# Patient Record
Sex: Male | Born: 1970 | Race: White | Hispanic: No | Marital: Married | Smoking: Never smoker
Health system: Southern US, Community
[De-identification: ages and names within clinical notes are randomized; demographics above are authoritative.]

## PROBLEM LIST (undated history)

## (undated) ENCOUNTER — Ambulatory Visit (HOSPITAL_COMMUNITY): Admission: EM | Payer: No Typology Code available for payment source | Source: Home / Self Care

## (undated) DIAGNOSIS — K219 Gastro-esophageal reflux disease without esophagitis: Secondary | ICD-10-CM

## (undated) DIAGNOSIS — R109 Unspecified abdominal pain: Secondary | ICD-10-CM

## (undated) DIAGNOSIS — E079 Disorder of thyroid, unspecified: Secondary | ICD-10-CM

## (undated) DIAGNOSIS — Z5189 Encounter for other specified aftercare: Secondary | ICD-10-CM

## (undated) DIAGNOSIS — W3400XA Accidental discharge from unspecified firearms or gun, initial encounter: Secondary | ICD-10-CM

## (undated) DIAGNOSIS — T7840XA Allergy, unspecified, initial encounter: Secondary | ICD-10-CM

## (undated) DIAGNOSIS — Y249XXA Unspecified firearm discharge, undetermined intent, initial encounter: Secondary | ICD-10-CM

## (undated) DIAGNOSIS — N189 Chronic kidney disease, unspecified: Secondary | ICD-10-CM

## (undated) DIAGNOSIS — R011 Cardiac murmur, unspecified: Secondary | ICD-10-CM

## (undated) DIAGNOSIS — M545 Low back pain: Secondary | ICD-10-CM

## (undated) DIAGNOSIS — R1319 Other dysphagia: Secondary | ICD-10-CM

## (undated) DIAGNOSIS — E039 Hypothyroidism, unspecified: Secondary | ICD-10-CM

## (undated) HISTORY — DX: Hypothyroidism, unspecified: E03.9

## (undated) HISTORY — PX: OTHER SURGICAL HISTORY: SHX169

## (undated) HISTORY — DX: Accidental discharge from unspecified firearms or gun, initial encounter: W34.00XA

## (undated) HISTORY — PX: KIDNEY SURGERY: SHX687

## (undated) HISTORY — DX: Unspecified firearm discharge, undetermined intent, initial encounter: Y24.9XXA

## (undated) HISTORY — PX: SPLENECTOMY, TOTAL: SHX788

## (undated) HISTORY — PX: HEMORROIDECTOMY: SUR656

## (undated) HISTORY — DX: Allergy, unspecified, initial encounter: T78.40XA

## (undated) HISTORY — DX: Cardiac murmur, unspecified: R01.1

## (undated) HISTORY — DX: Unspecified abdominal pain: R10.9

## (undated) HISTORY — DX: Encounter for other specified aftercare: Z51.89

## (undated) HISTORY — DX: Other dysphagia: R13.19

## (undated) HISTORY — DX: Chronic kidney disease, unspecified: N18.9

## (undated) HISTORY — DX: Disorder of thyroid, unspecified: E07.9

## (undated) HISTORY — DX: Low back pain: M54.5

## (undated) HISTORY — DX: Gastro-esophageal reflux disease without esophagitis: K21.9

---

## 2013-04-21 ENCOUNTER — Encounter: Payer: Self-pay | Admitting: General Practice

## 2013-04-21 ENCOUNTER — Ambulatory Visit (INDEPENDENT_AMBULATORY_CARE_PROVIDER_SITE_OTHER): Payer: 59 | Admitting: General Practice

## 2013-04-21 ENCOUNTER — Ambulatory Visit: Payer: Self-pay | Admitting: Physician Assistant

## 2013-04-21 VITALS — BP 128/83 | HR 60 | Temp 96.9°F | Ht 74.0 in | Wt 239.5 lb

## 2013-04-21 DIAGNOSIS — M545 Low back pain: Secondary | ICD-10-CM

## 2013-04-21 MED ORDER — METHYLPREDNISOLONE ACETATE 40 MG/ML IJ SUSP
40.0000 mg | Freq: Once | INTRAMUSCULAR | Status: AC
  Administered 2013-04-21: 40 mg via INTRAMUSCULAR

## 2013-04-21 NOTE — Progress Notes (Signed)
  Subjective:    Patient ID: Steve Taylor, male    DOB: 1971/02/07, 42 y.o.   MRN: [id number]  Back Pain This is a recurrent problem. The current episode started yesterday. The problem occurs intermittently. The problem has been gradually worsening since onset. The pain is present in the lumbar spine (Right). The pain does not radiate (reports it has radiated down leg in the past). The pain is at a severity of 6/10. The symptoms are aggravated by standing. Stiffness is present in the morning. Pertinent negatives include no bladder incontinence, bowel incontinence, chest pain, dysuria, fever, numbness, pelvic pain or tingling. He has tried nothing for the symptoms.       Review of Systems  Constitutional: Negative for fever and chills.  HENT: Negative for neck pain.   Respiratory: Negative for chest tightness and shortness of breath.   Cardiovascular: Negative for chest pain and palpitations.  Gastrointestinal: Negative for bowel incontinence.  Genitourinary: Negative for bladder incontinence, dysuria, difficulty urinating and pelvic pain.  Musculoskeletal: Positive for back pain.  Skin: Negative.   Neurological: Negative for tingling and numbness.  All other systems reviewed and are negative.       Objective:   Physical Exam  Constitutional: He is oriented to person, place, and time. He appears well-developed and well-nourished.  Cardiovascular: Normal rate, regular rhythm and normal heart sounds.   Pulmonary/Chest: Effort normal and breath sounds normal.  Neurological: He is alert and oriented to person, place, and time.  Skin: Skin is warm and dry.  Psychiatric: He has a normal mood and affect.          Assessment & Plan:  1. Lumbar pain - methylPREDNISolone acetate (DEPO-MEDROL) injection 40 mg; Inject 1 mL (40 mg total) into the muscle once. -rest -apply heat to affected area three times a day for 10 minutes -motrin or tylenol for discomfort as directed -RTO if  symptoms worsen -will monitor frequency of pain and consider ortho specialist -Patient verbalized understanding -Coralie Keens, FNP-C

## 2013-04-21 NOTE — Patient Instructions (Signed)
Back Pain, Adult  Low back pain is very common. About 1 in 5 people have back pain. The cause of low back pain is rarely dangerous. The pain often gets better over time. About half of people with a sudden onset of back pain feel better in just 2 weeks. About 8 in 10 people feel better by 6 weeks.   CAUSES  Some common causes of back pain include:  · Strain of the muscles or ligaments supporting the spine.  · Wear and tear (degeneration) of the spinal discs.  · Arthritis.  · Direct injury to the back.  DIAGNOSIS  Most of the time, the direct cause of low back pain is not known. However, back pain can be treated effectively even when the exact cause of the pain is unknown. Answering your caregiver's questions about your overall health and symptoms is one of the most accurate ways to make sure the cause of your pain is not dangerous. If your caregiver needs more information, he or she may order lab work or imaging tests (X-rays or MRIs). However, even if imaging tests show changes in your back, this usually does not require surgery.  HOME CARE INSTRUCTIONS  For many people, back pain returns. Since low back pain is rarely dangerous, it is often a condition that people can learn to manage on their own.   · Remain active. It is stressful on the back to sit or stand in one place. Do not sit, drive, or stand in one place for more than 30 minutes at a time. Take short walks on level surfaces as soon as pain allows. Try to increase the length of time you walk each day.  · Do not stay in bed. Resting more than 1 or 2 days can delay your recovery.  · Do not avoid exercise or work. Your body is made to move. It is not dangerous to be active, even though your back may hurt. Your back will likely heal faster if you return to being active before your pain is gone.  · Pay attention to your body when you  bend and lift. Many people have less discomfort when lifting if they bend their knees, keep the load close to their bodies, and  avoid twisting. Often, the most comfortable positions are those that put less stress on your recovering back.  · Find a comfortable position to sleep. Use a firm mattress and lie on your side with your knees slightly bent. If you lie on your back, put a pillow under your knees.  · Only take over-the-counter or prescription medicines as directed by your caregiver. Over-the-counter medicines to reduce pain and inflammation are often the most helpful. Your caregiver may prescribe muscle relaxant drugs. These medicines help dull your pain so you can more quickly return to your normal activities and healthy exercise.  · Put ice on the injured area.  · Put ice in a plastic bag.  · Place a towel between your skin and the bag.  · Leave the ice on for 15-20 minutes, 3-4 times a day for the first 2 to 3 days. After that, ice and heat may be alternated to reduce pain and spasms.  · Ask your caregiver about trying back exercises and gentle massage. This may be of some benefit.  · Avoid feeling anxious or stressed. Stress increases muscle tension and can worsen back pain. It is important to recognize when you are anxious or stressed and learn ways to manage it. Exercise is a great option.  SEEK MEDICAL CARE IF:  · You have pain that is not relieved with rest or   medicine.  · You have pain that does not improve in 1 week.  · You have new symptoms.  · You are generally not feeling well.  SEEK IMMEDIATE MEDICAL CARE IF:   · You have pain that radiates from your back into your legs.  · You develop new bowel or bladder control problems.  · You have unusual weakness or numbness in your arms or legs.  · You develop nausea or vomiting.  · You develop abdominal pain.  · You feel faint.  Document Released: 11/03/2005 Document Revised: 05/04/2012 Document Reviewed: 03/24/2011  Exit[employer]® Patient Information ©2014 [employer].

## 2013-06-16 ENCOUNTER — Ambulatory Visit: Payer: 59 | Admitting: General Practice

## 2014-01-16 ENCOUNTER — Encounter: Payer: Self-pay | Admitting: Family Medicine

## 2014-01-16 ENCOUNTER — Ambulatory Visit (INDEPENDENT_AMBULATORY_CARE_PROVIDER_SITE_OTHER): Payer: 59 | Admitting: Family Medicine

## 2014-01-16 VITALS — BP 121/87 | HR 63 | Temp 97.5°F | Ht 74.0 in | Wt 236.2 lb

## 2014-01-16 DIAGNOSIS — J069 Acute upper respiratory infection, unspecified: Secondary | ICD-10-CM

## 2014-01-16 MED ORDER — AZITHROMYCIN 250 MG PO TABS: ORAL_TABLET | ORAL | Status: DC

## 2014-01-16 NOTE — Progress Notes (Signed)
   Subjective:    Patient ID: Steve Taylor, male    DOB: 10-08-1971, 43 y.o.   MRN: [id number]  HPI  This 43 y.o. male presents for evaluation of uri sx's and cough for over a week.  He is taking Parkline that is not helping.  Review of Systems    No chest pain, SOB, HA, dizziness, vision change, N/V, diarrhea, constipation, dysuria, urinary urgency or frequency, myalgias, arthralgias or rash.  Objective:   Physical Exam  Vital signs noted  Well developed well nourished male.  HEENT - Head atraumatic Normocephalic                Eyes - PERRLA, Conjuctiva - clear Sclera- Clear EOMI                Ears - EAC's Wnl TM's Wnl Gross Hearing WNL                Nose - Nares patent                 Throat - oropharanx wnl Respiratory - Lungs CTA bilateral Cardiac - RRR S1 and S2 without murmur GI - Abdomen soft Nontender and bowel sounds active x 4 Extremities - No edema. Neuro - Grossly intact.      Assessment & Plan:  URI (upper respiratory infection) - Plan: azithromycin (ZITHROMAX) 250 MG tablet Push po fluids, rest, tylenol and motrin otc prn as directed for fever, arthralgias, and myalgias.  Follow up prn if sx's continue or persist.  Lysbeth Penner FNP

## 2014-03-22 ENCOUNTER — Other Ambulatory Visit: Payer: Self-pay | Admitting: *Deleted

## 2014-03-22 DIAGNOSIS — Z0000 Encounter for general adult medical examination without abnormal findings: Secondary | ICD-10-CM

## 2014-03-24 ENCOUNTER — Encounter (INDEPENDENT_AMBULATORY_CARE_PROVIDER_SITE_OTHER): Payer: Self-pay

## 2014-03-24 ENCOUNTER — Other Ambulatory Visit (INDEPENDENT_AMBULATORY_CARE_PROVIDER_SITE_OTHER): Payer: 59

## 2014-03-24 DIAGNOSIS — Z0000 Encounter for general adult medical examination without abnormal findings: Secondary | ICD-10-CM

## 2014-03-24 LAB — POCT CBC
Granulocyte percent: 64.3 %G (ref 37–80)
HEMATOCRIT: 46.7 % (ref 43.5–53.7)
HEMOGLOBIN: 15.1 g/dL (ref 14.1–18.1)
Lymph, poc: 3.5 — AB (ref 0.6–3.4)
MCH: 29.8 pg (ref 27–31.2)
MCHC: 32.5 g/dL (ref 31.8–35.4)
MCV: 91.7 fL (ref 80–97)
MPV: 9.5 fL (ref 0–99.8)
POC Granulocyte: 6.4 (ref 2–6.9)
POC LYMPH PERCENT: 34.6 %L (ref 10–50)
Platelet Count, POC: 323 10*3/uL (ref 142–424)
RBC: 5.1 M/uL (ref 4.69–6.13)
RDW, POC: 14 %
WBC: 10 10*3/uL (ref 4.6–10.2)

## 2014-03-24 NOTE — Progress Notes (Signed)
Pt came in for lab  only 

## 2014-03-25 LAB — CMP14+EGFR
ALBUMIN: 4.2 g/dL (ref 3.5–5.5)
ALK PHOS: 74 IU/L (ref 39–117)
ALT: 32 IU/L (ref 0–44)
AST: 22 IU/L (ref 0–40)
Albumin/Globulin Ratio: 1.7 (ref 1.1–2.5)
BILIRUBIN TOTAL: 0.6 mg/dL (ref 0.0–1.2)
BUN / CREAT RATIO: 14 (ref 9–20)
BUN: 14 mg/dL (ref 6–24)
CHLORIDE: 102 mmol/L (ref 97–108)
CO2: 26 mmol/L (ref 18–29)
Calcium: 9.3 mg/dL (ref 8.7–10.2)
Creatinine, Ser: 1 mg/dL (ref 0.76–1.27)
GFR calc non Af Amer: 93 mL/min/{1.73_m2} (ref >59)
GFR, EST AFRICAN AMERICAN: 108 mL/min/{1.73_m2} (ref >59)
Globulin, Total: 2.5 g/dL (ref 1.5–4.5)
Glucose: 99 mg/dL (ref 65–99)
POTASSIUM: 4.3 mmol/L (ref 3.5–5.2)
SODIUM: 142 mmol/L (ref 134–144)
Total Protein: 6.7 g/dL (ref 6.0–8.5)

## 2014-03-25 LAB — LIPID PANEL
CHOLESTEROL TOTAL: 187 mg/dL (ref 100–199)
Chol/HDL Ratio: 5.5 ratio units — ABNORMAL HIGH (ref 0.0–5.0)
HDL: 34 mg/dL — AB (ref >39)
LDL Calculated: 118 mg/dL — ABNORMAL HIGH (ref 0–99)
TRIGLYCERIDES: 175 mg/dL — AB (ref 0–149)
VLDL CHOLESTEROL CAL: 35 mg/dL (ref 5–40)

## 2014-03-25 LAB — THYROID PANEL WITH TSH
FREE THYROXINE INDEX: 1.6 (ref 1.2–4.9)
T3 UPTAKE RATIO: 29 % (ref 24–39)
T4, Total: 5.6 ug/dL (ref 4.5–12.0)
TSH: 7.52 u[IU]/mL — AB (ref 0.450–4.500)

## 2014-03-30 ENCOUNTER — Encounter: Payer: Self-pay | Admitting: Physician Assistant

## 2014-03-30 ENCOUNTER — Ambulatory Visit (INDEPENDENT_AMBULATORY_CARE_PROVIDER_SITE_OTHER): Payer: 59 | Admitting: Physician Assistant

## 2014-03-30 VITALS — BP 132/89 | HR 54 | Temp 98.1°F | Wt 237.0 lb

## 2014-03-30 DIAGNOSIS — M545 Low back pain, unspecified: Secondary | ICD-10-CM

## 2014-03-30 DIAGNOSIS — Z0000 Encounter for general adult medical examination without abnormal findings: Secondary | ICD-10-CM

## 2014-03-30 DIAGNOSIS — M549 Dorsalgia, unspecified: Secondary | ICD-10-CM

## 2014-03-30 HISTORY — DX: Low back pain, unspecified: M54.50

## 2014-03-30 MED ORDER — METHYLPREDNISOLONE ACETATE 80 MG/ML IJ SUSP
80.0000 mg | Freq: Once | INTRAMUSCULAR | Status: AC
  Administered 2014-03-30: 80 mg via INTRAMUSCULAR

## 2014-03-30 NOTE — Progress Notes (Signed)
Subjective:     Patient ID: Steve Taylor, male   DOB: [birth year], 43 y.o.   MRN: [id number]  HPI Pt here today for a PE Pt has noted some recent fatigue and was concerned  He normally is very active Sx have since resolved Notes a hx of prev elevation of TSH that returned to normal with f/u testing Pt also with recent flare of LBP without an injury He will get this ~ 1-2 times a year No hx of prev back surgery  Review of Systems  Constitutional: Positive for fatigue.  Musculoskeletal: Positive for back pain.       No radiation of pain to the lower ext Primary pain to the R L-spine  All other systems reviewed and are negative.      Objective:   Physical Exam  Nursing note and vitals reviewed. Constitutional: He is oriented to person, place, and time. He appears well-developed and well-nourished.  HENT:  Head: Normocephalic.  Right Ear: External ear normal.  Left Ear: External ear normal.  Mouth/Throat: Oropharynx is clear and moist. No oropharyngeal exudate.  Eyes: Conjunctivae and EOM are normal. Pupils are equal, round, and reactive to light. Right eye exhibits no discharge. Left eye exhibits no discharge.  Neck: Normal range of motion. Neck supple. No JVD present. No thyromegaly present.  Cardiovascular: Normal rate, regular rhythm, normal heart sounds and intact distal pulses.   Pulmonary/Chest: Effort normal and breath sounds normal.  Abdominal: Soft. Bowel sounds are normal. He exhibits no distension and no mass. There is no tenderness. There is no rebound.  Musculoskeletal: Normal range of motion. He exhibits tenderness. He exhibits no edema.  + TTP along the entire L-spine ++ TTP over the R SI joint No area of ecchy or palp muscle spasm  Lymphadenopathy:    He has no cervical adenopathy.  Neurological: He is alert and oriented to person, place, and time. He has normal reflexes.  Skin: Skin is warm.  Psychiatric: He has a normal mood and affect. His behavior is  normal.       Assessment:     Physical exam LBP Fatigue    Plan:     Reviewed lab with pt today His TSH is sl elevated Discussed options with pt  He would like to wait 1 month a retest to see if this returns to nl Recommended PSA testing at that time as well Depomedrol  80 IM today that has worked well for his LBP in the past Reviewed ways with him to minimize hs LBP flares F/U in 1 month for retesting of labs sooner if any problems

## 2014-03-30 NOTE — Patient Instructions (Signed)

## 2014-04-24 ENCOUNTER — Other Ambulatory Visit (INDEPENDENT_AMBULATORY_CARE_PROVIDER_SITE_OTHER): Payer: 59

## 2014-04-24 DIAGNOSIS — R7989 Other specified abnormal findings of blood chemistry: Secondary | ICD-10-CM

## 2014-04-24 DIAGNOSIS — Z125 Encounter for screening for malignant neoplasm of prostate: Secondary | ICD-10-CM

## 2014-04-25 LAB — TSH: TSH: 8.74 u[IU]/mL — ABNORMAL HIGH (ref 0.450–4.500)

## 2014-04-25 LAB — PSA, TOTAL AND FREE
PSA, Free Pct: 34.5 %
PSA, Free: 0.76 ng/mL
PSA: 2.2 ng/mL (ref 0.0–4.0)

## 2014-04-26 ENCOUNTER — Other Ambulatory Visit: Payer: Self-pay | Admitting: Family Medicine

## 2014-04-28 ENCOUNTER — Other Ambulatory Visit: Payer: Self-pay | Admitting: Physician Assistant

## 2014-04-28 MED ORDER — LEVOTHYROXINE SODIUM 25 MCG PO TABS: 25.0000 ug | ORAL_TABLET | Freq: Every day | ORAL | Status: DC

## 2014-04-28 NOTE — Addendum Note (Signed)
Addended by: Lodema Pilot on: 04/28/2014 05:29 PM   Modules accepted: Orders

## 2014-05-22 ENCOUNTER — Telehealth: Payer: Self-pay | Admitting: General Practice

## 2014-05-22 DIAGNOSIS — R1310 Dysphagia, unspecified: Secondary | ICD-10-CM

## 2014-05-22 NOTE — Telephone Encounter (Signed)
Referral made 

## 2014-05-22 NOTE — Telephone Encounter (Signed)
Aware. 

## 2014-05-25 ENCOUNTER — Encounter: Payer: Self-pay | Admitting: Nurse Practitioner

## 2014-05-29 ENCOUNTER — Encounter: Payer: Self-pay | Admitting: *Deleted

## 2014-06-05 ENCOUNTER — Encounter: Payer: Self-pay | Admitting: Nurse Practitioner

## 2014-06-05 ENCOUNTER — Ambulatory Visit (INDEPENDENT_AMBULATORY_CARE_PROVIDER_SITE_OTHER): Payer: 59 | Admitting: Nurse Practitioner

## 2014-06-05 VITALS — BP 126/84 | HR 84 | Ht 75.0 in | Wt 231.0 lb

## 2014-06-05 DIAGNOSIS — R1319 Other dysphagia: Secondary | ICD-10-CM | POA: Insufficient documentation

## 2014-06-05 HISTORY — DX: Other dysphagia: R13.19

## 2014-06-05 NOTE — Patient Instructions (Signed)

## 2014-06-05 NOTE — Progress Notes (Signed)
    HPI :  Patient is a 43 year old male, new to this practice, here for evaluation of dysphasia. Approximately 5 years ago the patient went to the emergency department for food impaction. It sounds like he was administered glucagon and food was subsequently regurgitated . Since that time patient has had intermittent solid food dysphagia, mainly to meat. No weight loss. No chest pain. He has no history of GERD. Comprehensive metabolic profile and CBC in May of this year was normal.  Past Medical History  Diagnosis Date  . Thyroid disease     Family History  Problem Relation Age of Onset  . Lung cancer Father   . Brain cancer Maternal Grandmother   . Diabetes Mother   . Diabetes Maternal Grandfather    History  Substance Use Topics  . Smoking status: Never Smoker   . Smokeless tobacco: Not on file  . Alcohol Use: Yes     Comment: occ   Current Outpatient Prescriptions  Medication Sig Dispense Refill  . levothyroxine (SYNTHROID, LEVOTHROID) 25 MCG tablet Take 25 mcg by mouth daily before breakfast.       No current facility-administered medications for this visit.   No Known Allergies   Review of Systems: All systems reviewed and negative except where noted in HPI.   Physical Exam: BP 126/84  Pulse 84  Ht 6\' 3"  (1.905 m)  Wt 231 lb (104.781 kg)  BMI 28.87 kg/m2 Constitutional: Pleasant,well-developed, white male in no acute distress. HEENT: Normocephalic and atraumatic. Conjunctivae are normal. No scleral icterus. Neck supple.  Cardiovascular: Normal rate, regular rhythm.  Pulmonary/chest: Effort normal and breath sounds normal. No wheezing, rales or rhonchi. Abdominal: Soft, nondistended, nontender. Bowel sounds active throughout. There are no masses palpable. No hepatomegaly. Extremities: no edema Lymphadenopathy: No cervical adenopathy noted. Neurological: Alert and oriented to person place and time. Skin: Skin is warm and dry. No rashes noted. Psychiatric: Normal  mood and affect. Behavior is normal.   ASSESSMENT AND PLAN:   43 year old male with a five-year history of intermittent solid food dysphasia. Rule out esophageal stricture, eosinophilic esophagitis. For further evaluation patient will be scheduled for EGD with probable dilation. The benefits, risks, and potential complications of EGD with possible biopsies and/or dilation were discussed with the patient and he agrees to proceed.

## 2014-06-05 NOTE — Progress Notes (Signed)
Reviewed and agree with management. Marsha Hillman D. Richele Strand, M.D., FACG  

## 2014-06-13 ENCOUNTER — Telehealth: Payer: Self-pay | Admitting: *Deleted

## 2014-06-13 NOTE — Telephone Encounter (Signed)
I told the patient his procedure is not 4:00 PM and not 3:30 Pm .  I neglected to put his case in Epic and that spot was taken. He said the 4:00 Pm time is fine.  I told him I will mail him new instructions and he thanked me for calling.

## 2014-06-19 ENCOUNTER — Other Ambulatory Visit (INDEPENDENT_AMBULATORY_CARE_PROVIDER_SITE_OTHER): Payer: 59

## 2014-06-19 DIAGNOSIS — R7989 Other specified abnormal findings of blood chemistry: Secondary | ICD-10-CM

## 2014-06-20 LAB — THYROID PANEL WITH TSH
FREE THYROXINE INDEX: 2.1 (ref 1.2–4.9)
T3 UPTAKE RATIO: 30 % (ref 24–39)
T4 TOTAL: 7.1 ug/dL (ref 4.5–12.0)
TSH: 4.51 u[IU]/mL — ABNORMAL HIGH (ref 0.450–4.500)

## 2014-06-21 ENCOUNTER — Telehealth: Payer: Self-pay | Admitting: *Deleted

## 2014-06-21 MED ORDER — LEVOTHYROXINE SODIUM 25 MCG PO TABS: 25.0000 ug | ORAL_TABLET | Freq: Every day | ORAL | Status: DC

## 2014-06-21 NOTE — Telephone Encounter (Signed)
done

## 2014-07-26 ENCOUNTER — Encounter: Payer: Self-pay | Admitting: Gastroenterology

## 2014-07-26 ENCOUNTER — Ambulatory Visit (AMBULATORY_SURGERY_CENTER): Payer: 59 | Admitting: Gastroenterology

## 2014-07-26 VITALS — BP 143/65 | HR 49 | Temp 97.7°F | Resp 23 | Ht 75.0 in | Wt 231.0 lb

## 2014-07-26 DIAGNOSIS — R1319 Other dysphagia: Secondary | ICD-10-CM

## 2014-07-26 DIAGNOSIS — K222 Esophageal obstruction: Secondary | ICD-10-CM

## 2014-07-26 MED ORDER — SODIUM CHLORIDE 0.9 % IV SOLN: 500.0000 mL | INTRAVENOUS | Status: DC

## 2014-07-26 NOTE — Patient Instructions (Addendum)

## 2014-07-26 NOTE — Progress Notes (Signed)
Report to PACU, RN, vss, BBS= Clear.  

## 2014-07-26 NOTE — Progress Notes (Signed)
Called to room to assist during endoscopic procedure.  Patient ID and intended procedure confirmed with present staff. Received instructions for my participation in the procedure from the performing physician.  

## 2014-07-26 NOTE — Op Note (Signed)
[facility]  [address]. [place], [address]   ENDOSCOPY PROCEDURE REPORT  PATIENT: Steve Taylor, Steve Taylor  MR#: [id number] BIRTHDATE: Apr 03, 1971 , 43  yrs. old GENDER: Male ENDOSCOPIST: Inda Castle, MD REFERRED BY:  Redge Gainer, M.D. PROCEDURE DATE:  07/26/2014 PROCEDURE:  EGD, diagnostic and Maloney dilation of esophagus ASA CLASS:     Class II INDICATIONS:  Dysphagia. MEDICATIONS: MAC sedation, administered by CRNA and propofol (Diprivan) 200mg  IV TOPICAL ANESTHETIC:  DESCRIPTION OF PROCEDURE: After the risks benefits and alternatives of the procedure were thoroughly explained, informed consent was obtained.  The LB [id number] endoscope was introduced through the mouth and advanced to the third portion of the duodenum. Without limitations.  The instrument was slowly withdrawn as the mucosa was fully examined.        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  The stenosis was traversable with the endoscope.  The remainder of the upper endoscopy exam was otherwise normal. Retroflexed views revealed no abnormalities.     The scope was then withdrawn from the patient and the procedure completed.  A #52 Isabell Jarvis dilator was passed with moderate resistance.  There was no heme.  COMPLICATIONS: There were no complications. ENDOSCOPIC IMPRESSION: esophageal stricture-status post Venia Minks dilation  RECOMMENDATIONS: Repeat dilation as needed REPEAT EXAM:  eSigned:  Inda Castle, MD 07/26/2014 4:03 PM   CC:

## 2014-07-27 ENCOUNTER — Telehealth: Payer: Self-pay | Admitting: *Deleted

## 2014-07-27 NOTE — Telephone Encounter (Signed)
  Follow up Call-  Call back number 07/26/2014  Post procedure Call Back phone  # [phone] cell  Permission to leave phone message Yes     No answer at # given.  Left message on voicemail.

## 2015-05-01 ENCOUNTER — Encounter (INDEPENDENT_AMBULATORY_CARE_PROVIDER_SITE_OTHER): Payer: Self-pay

## 2015-05-01 ENCOUNTER — Ambulatory Visit (INDEPENDENT_AMBULATORY_CARE_PROVIDER_SITE_OTHER): Payer: 59 | Admitting: Physician Assistant

## 2015-05-01 ENCOUNTER — Encounter: Payer: Self-pay | Admitting: Physician Assistant

## 2015-05-01 VITALS — BP 133/95 | HR 87 | Temp 97.3°F | Ht 75.0 in | Wt 234.0 lb

## 2015-05-01 DIAGNOSIS — L255 Unspecified contact dermatitis due to plants, except food: Secondary | ICD-10-CM | POA: Diagnosis not present

## 2015-05-01 MED ORDER — PREDNISONE 10 MG (21) PO TBPK: ORAL_TABLET | ORAL | Status: DC

## 2015-05-01 MED ORDER — METHYLPREDNISOLONE ACETATE 80 MG/ML IJ SUSP
80.0000 mg | Freq: Once | INTRAMUSCULAR | Status: AC
  Administered 2015-05-01: 80 mg via INTRAMUSCULAR

## 2015-05-01 MED ORDER — TRIAMCINOLONE ACETONIDE 0.1 % EX CREA: 1.0000 "application " | TOPICAL_CREAM | Freq: Two times a day (BID) | CUTANEOUS | Status: DC

## 2015-05-01 NOTE — Progress Notes (Signed)
   Subjective:    Patient ID: Steve Taylor, male    DOB: 1970/12/23, 44 y.o.   MRN: [id number]  HPI 44 y/o male presents with c/o poison oak x 1 day. He was exposed to it yesterday afternoon while weedeating. He has had similar episodes before with relief from steroid injection     Review of Systems  Skin: Positive for rash (BUE, face, chest - pruritic ).  All other systems reviewed and are negative.      Objective:   Physical Exam  Constitutional: He appears well-developed and well-nourished.  Skin:  erythematous linear with papules and vesicular rash on BUE, face          Assessment & Plan:  1. Contact dermatitis due to plant  - methylPREDNISolone acetate (DEPO-MEDROL) injection 80 mg; Inject 1 mL (80 mg total) into the muscle once. - predniSONE (STERAPRED UNI-PAK 21 TAB) 10 MG (21) TBPK tablet; 6 pills on day 1, 5 pills on day 2, 4 pills on day 3, 3 pills on day 4, 2 pills on day 5, 1 pill on day 6  Dispense: 21 tablet; Refill: 0 - triamcinolone cream (KENALOG) 0.1 %; Apply 1 application topically 2 (two) times daily. To affected area  Dispense: 80 g; Refill: 0  F/U prn   Garrison Michie A. Benjamin Stain PA-C

## 2015-07-25 ENCOUNTER — Encounter (INDEPENDENT_AMBULATORY_CARE_PROVIDER_SITE_OTHER): Payer: Self-pay

## 2015-07-25 ENCOUNTER — Encounter: Payer: Self-pay | Admitting: Physician Assistant

## 2015-07-25 ENCOUNTER — Ambulatory Visit (INDEPENDENT_AMBULATORY_CARE_PROVIDER_SITE_OTHER): Payer: 59 | Admitting: Physician Assistant

## 2015-07-25 VITALS — BP 138/94 | HR 64 | Temp 98.2°F | Ht 75.0 in | Wt 234.0 lb

## 2015-07-25 DIAGNOSIS — D485 Neoplasm of uncertain behavior of skin: Secondary | ICD-10-CM | POA: Diagnosis not present

## 2015-07-27 ENCOUNTER — Encounter: Payer: 59 | Admitting: Family Medicine

## 2015-07-30 ENCOUNTER — Other Ambulatory Visit: Payer: Self-pay | Admitting: Physician Assistant

## 2015-07-30 DIAGNOSIS — T814XXA Infection following a procedure, initial encounter: Principal | ICD-10-CM

## 2015-07-30 DIAGNOSIS — IMO0001 Reserved for inherently not codable concepts without codable children: Secondary | ICD-10-CM

## 2015-07-30 MED ORDER — SULFAMETHOXAZOLE-TRIMETHOPRIM 800-160 MG PO TABS: 1.0000 | ORAL_TABLET | Freq: Every day | ORAL | Status: DC

## 2015-08-01 ENCOUNTER — Encounter: Payer: Self-pay | Admitting: Physician Assistant

## 2015-08-01 ENCOUNTER — Ambulatory Visit (INDEPENDENT_AMBULATORY_CARE_PROVIDER_SITE_OTHER): Payer: 59 | Admitting: Physician Assistant

## 2015-08-01 VITALS — BP 135/92 | HR 79 | Temp 98.0°F | Ht 75.0 in | Wt 238.0 lb

## 2015-08-01 DIAGNOSIS — Z4802 Encounter for removal of sutures: Secondary | ICD-10-CM | POA: Diagnosis not present

## 2015-08-01 DIAGNOSIS — D485 Neoplasm of uncertain behavior of skin: Secondary | ICD-10-CM

## 2015-08-01 LAB — PATHOLOGY

## 2015-08-05 ENCOUNTER — Encounter: Payer: Self-pay | Admitting: Physician Assistant

## 2015-08-05 NOTE — Progress Notes (Signed)
Patient ID: Steve Taylor, male   DOB: 1971/05/18, 44 y.o.   MRN: [id number]  44 y/o male presents with enlarging "mole" on the back of his head. It is irritated by his cap, when he gets a hair cut and occasionally bleeds.   1cm raised flesh colored papule on posterior scalp was noted and excised via punch excision r/o dysplasia. 1- 4.0 vicryl suture was applied and 2- 5.0 nylon sutures. F/U in 7 days for suture removal   1. Neoplasm of uncertain behavior of skin - Pathology   F/U 7 days   Tiffany A. Benjamin Stain PA-C

## 2015-08-05 NOTE — Progress Notes (Signed)
Patient ID: KEONE KAMER, male   DOB: May 22, 1971, 44 y.o.   MRN: [id number]  44 y/o male presents for removal of sutures s/p excision of nevus on posterior scalp 7 days ago. He has not had any complications.   PE reveals slight irritation d/t internal suture being slightly superficial. Negative for drainage.   Sutures were removed x 2. No further treatment required at this time.   Tiffany A. Benjamin Stain PA-C

## 2015-10-16 ENCOUNTER — Telehealth: Payer: Self-pay | Admitting: Family Medicine

## 2015-11-17 ENCOUNTER — Telehealth: Payer: Self-pay | Admitting: Pediatrics

## 2015-11-17 MED ORDER — AMOXICILLIN 500 MG PO CAPS: 500.0000 mg | ORAL_CAPSULE | Freq: Two times a day (BID) | ORAL | Status: DC

## 2015-11-17 NOTE — Telephone Encounter (Signed)
After hours call, red throat, white exudates, no cough, muscle aches. Not sure if having LAD or not. Ideally would send for strep test, clinic closed next three days. Sent in amox rx.

## 2015-11-23 ENCOUNTER — Ambulatory Visit (INDEPENDENT_AMBULATORY_CARE_PROVIDER_SITE_OTHER): Payer: 59 | Admitting: Family Medicine

## 2015-11-23 ENCOUNTER — Encounter: Payer: Self-pay | Admitting: Family Medicine

## 2015-11-23 VITALS — BP 127/86 | HR 78 | Temp 97.5°F | Ht 75.0 in | Wt 242.8 lb

## 2015-11-23 DIAGNOSIS — R109 Unspecified abdominal pain: Secondary | ICD-10-CM

## 2015-11-23 DIAGNOSIS — R1011 Right upper quadrant pain: Secondary | ICD-10-CM | POA: Diagnosis not present

## 2015-11-23 HISTORY — DX: Unspecified abdominal pain: R10.9
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
## 2015-11-23 NOTE — Patient Instructions (Signed)
Great to see you!  If you develop a rash let us know right away  If you do not get better as expected or worsen please let us know or come back.

## 2015-11-24 LAB — CBC WITH DIFFERENTIAL/PLATELET
BASOS ABS: 0.2 10*3/uL (ref 0.0–0.2)
Basos: 2 %
EOS (ABSOLUTE): 0.6 10*3/uL — AB (ref 0.0–0.4)
Eos: 5 %
Hematocrit: 43.5 % (ref 37.5–51.0)
Hemoglobin: 15.5 g/dL (ref 12.6–17.7)
Immature Grans (Abs): 0 10*3/uL (ref 0.0–0.1)
Immature Granulocytes: 0 %
Lymphocytes Absolute: 4.1 10*3/uL — ABNORMAL HIGH (ref 0.7–3.1)
Lymphs: 36 %
MCH: 30.4 pg (ref 26.6–33.0)
MCHC: 35.6 g/dL (ref 31.5–35.7)
MCV: 85 fL (ref 79–97)
Monocytes Absolute: 0.9 10*3/uL (ref 0.1–0.9)
Monocytes: 8 %
NEUTROS ABS: 5.6 10*3/uL (ref 1.4–7.0)
NEUTROS PCT: 49 %
PLATELETS: 408 10*3/uL — AB (ref 150–379)
RBC: 5.1 x10E6/uL (ref 4.14–5.80)
RDW: 14.6 % (ref 12.3–15.4)
WBC: 11.4 10*3/uL — ABNORMAL HIGH (ref 3.4–10.8)

## 2015-11-24 LAB — CMP14+EGFR
A/G RATIO: 1.9 (ref 1.1–2.5)
ALK PHOS: 79 IU/L (ref 39–117)
ALT: 55 IU/L — ABNORMAL HIGH (ref 0–44)
AST: 36 IU/L (ref 0–40)
Albumin: 4.5 g/dL (ref 3.5–5.5)
BILIRUBIN TOTAL: 0.4 mg/dL (ref 0.0–1.2)
BUN/Creatinine Ratio: 12 (ref 9–20)
BUN: 13 mg/dL (ref 6–24)
CHLORIDE: 102 mmol/L (ref 96–106)
CO2: 21 mmol/L (ref 18–29)
Calcium: 9.6 mg/dL (ref 8.7–10.2)
Creatinine, Ser: 1.1 mg/dL (ref 0.76–1.27)
GFR calc Af Amer: 95 mL/min/{1.73_m2} (ref >59)
GFR calc non Af Amer: 82 mL/min/{1.73_m2} (ref >59)
Globulin, Total: 2.4 g/dL (ref 1.5–4.5)
Glucose: 105 mg/dL — ABNORMAL HIGH (ref 65–99)
POTASSIUM: 4.1 mmol/L (ref 3.5–5.2)
Sodium: 144 mmol/L (ref 134–144)
Total Protein: 6.9 g/dL (ref 6.0–8.5)

## 2015-11-24 LAB — LIPID PANEL
CHOLESTEROL TOTAL: 209 mg/dL — AB (ref 100–199)
Chol/HDL Ratio: 7 ratio units — ABNORMAL HIGH (ref 0.0–5.0)
HDL: 30 mg/dL — AB (ref >39)
TRIGLYCERIDES: 473 mg/dL — AB (ref 0–149)

## 2015-11-24 LAB — LIPASE: LIPASE: 49 U/L (ref 0–59)

## 2015-11-26 ENCOUNTER — Telehealth: Payer: Self-pay | Admitting: Family Medicine

## 2015-11-26 DIAGNOSIS — R1011 Right upper quadrant pain: Secondary | ICD-10-CM

## 2015-11-26 NOTE — Telephone Encounter (Signed)
Absolutely, ordered.   Laroy Apple, MD [facility] 11/26/2015, 5:04 PM

## 2015-11-26 NOTE — Telephone Encounter (Signed)
Called and discussed labs  Mild leukocytosis, mildly elevated PLT, mildly elevated platelets. Still no clear etiology Considering his elevated triglycerides and cholesterol of asked him to come back for fasting labs in the next 2 weeks, that time I'll repeat CMP and add on acute hepatitis panel for completeness  He denies tylenol and alcohol use.   Laroy Apple, MD [facility] 11/26/2015, 1:18 PM

## 2015-11-27 ENCOUNTER — Other Ambulatory Visit (INDEPENDENT_AMBULATORY_CARE_PROVIDER_SITE_OTHER): Payer: 59

## 2015-11-27 DIAGNOSIS — R1011 Right upper quadrant pain: Secondary | ICD-10-CM | POA: Diagnosis not present

## 2015-11-27 NOTE — Progress Notes (Signed)
Lab only 

## 2015-11-27 NOTE — Telephone Encounter (Signed)
Patients wife aware

## 2015-11-28 LAB — LIPID PANEL
CHOLESTEROL TOTAL: 204 mg/dL — AB (ref 100–199)
Chol/HDL Ratio: 5.4 ratio units — ABNORMAL HIGH (ref 0.0–5.0)
HDL: 38 mg/dL — ABNORMAL LOW (ref >39)
LDL Calculated: 141 mg/dL — ABNORMAL HIGH (ref 0–99)
Triglycerides: 126 mg/dL (ref 0–149)
VLDL Cholesterol Cal: 25 mg/dL (ref 5–40)

## 2015-11-28 LAB — CMP14+EGFR
ALBUMIN: 4.3 g/dL (ref 3.5–5.5)
ALK PHOS: 78 IU/L (ref 39–117)
ALT: 56 IU/L — ABNORMAL HIGH (ref 0–44)
AST: 37 IU/L (ref 0–40)
Albumin/Globulin Ratio: 1.7 (ref 1.1–2.5)
BUN / CREAT RATIO: 13 (ref 9–20)
BUN: 12 mg/dL (ref 6–24)
Bilirubin Total: 0.8 mg/dL (ref 0.0–1.2)
CALCIUM: 9.7 mg/dL (ref 8.7–10.2)
CO2: 23 mmol/L (ref 18–29)
CREATININE: 0.96 mg/dL (ref 0.76–1.27)
Chloride: 103 mmol/L (ref 96–106)
GFR calc Af Amer: 112 mL/min/{1.73_m2} (ref >59)
GFR, EST NON AFRICAN AMERICAN: 97 mL/min/{1.73_m2} (ref >59)
GLOBULIN, TOTAL: 2.5 g/dL (ref 1.5–4.5)
GLUCOSE: 98 mg/dL (ref 65–99)
Potassium: 4.1 mmol/L (ref 3.5–5.2)
SODIUM: 144 mmol/L (ref 134–144)
TOTAL PROTEIN: 6.8 g/dL (ref 6.0–8.5)

## 2015-11-28 LAB — HEPATITIS PANEL, ACUTE
HEP A IGM: NEGATIVE
HEP B S AG: NEGATIVE
Hep B C IgM: NEGATIVE
Hep C Virus Ab: <0.1 s/co ratio (ref 0.0–0.9)

## 2015-11-28 LAB — TSH: TSH: 6.83 u[IU]/mL — AB (ref 0.450–4.500)

## 2015-11-29 ENCOUNTER — Telehealth: Payer: Self-pay | Admitting: Family Medicine

## 2015-11-29 NOTE — Telephone Encounter (Signed)
I called and discussed his labs at length.  His ALC is stable but still slightly elevated. Considering that he does not drink alcohol or take Tylenol this is most likely due to [place]. Has elevated LDL. History of ASCVD risk score is not high enough to consider statins. I've recommended diet and exercise changes.  I also offered an abdominal ultrasound to evaluate his liver for Karlene Lineman as well as to consider the possibility of adhesions associated with his old scar. At this time he feels like he would like to watch and wait. He will follow-up in 3-4 months to get his thyroid rechecked and to recheck his liver labs. He will call if his pain worsens and he would like to get the ultrasound.   He was not taking his Synthroid, simply because he did not want to. I recommended starting it, he has it at home already, to very low dose I recommended recheck in 2-3 months along with his liver labs and likely titration of medication.  Laroy Apple, MD [facility] 11/29/2015, 5:41 PM

## 2016-02-14 ENCOUNTER — Ambulatory Visit (INDEPENDENT_AMBULATORY_CARE_PROVIDER_SITE_OTHER): Payer: 59 | Admitting: Family Medicine

## 2016-02-14 ENCOUNTER — Encounter: Payer: Self-pay | Admitting: Family Medicine

## 2016-02-14 VITALS — BP 131/93 | HR 63 | Temp 97.2°F | Ht 75.0 in | Wt 243.0 lb

## 2016-02-14 DIAGNOSIS — J189 Pneumonia, unspecified organism: Secondary | ICD-10-CM | POA: Diagnosis not present

## 2016-02-14 MED ORDER — TRIAMCINOLONE ACETONIDE 40 MG/ML IJ SUSP
60.0000 mg | Freq: Once | INTRAMUSCULAR | Status: AC
Start: 2016-02-14 — End: 2016-02-14
  Administered 2016-02-14: 60 mg via INTRAMUSCULAR

## 2016-02-14 MED ORDER — LEVOFLOXACIN 500 MG PO TABS: 500.0000 mg | ORAL_TABLET | Freq: Every day | ORAL | Status: DC

## 2016-02-14 NOTE — Patient Instructions (Signed)
Great to see you!  I am treating you for pneumonia, that is a bacterial infection of the lungs., It is not as contagious as a virus but you do want to practice good hand washing and avoid coughing around your baby.   Take all of the antibiotics    Community-Acquired Pneumonia, Adult Pneumonia is an infection of the lungs. There are different types of pneumonia. One type can develop while a person is in a hospital. A different type, called community-acquired pneumonia, develops in people who are not, or have not recently been, in the hospital or other health care facility.  CAUSES Pneumonia may be caused by bacteria, viruses, or funguses. Community-acquired pneumonia is often caused by Streptococcus pneumonia bacteria. These bacteria are often passed from one person to another by breathing in droplets from the cough or sneeze of an infected person. RISK FACTORS The condition is more likely to develop in:  People who havechronic diseases, such as chronic obstructive pulmonary disease (COPD), asthma, congestive heart failure, cystic fibrosis, diabetes, or kidney disease.  People who haveearly-stage or late-stage HIV.  People who havesickle cell disease.  People who havehad their spleen removed (splenectomy).  People who havepoor Human resources officer.  People who havemedical conditions that increase the risk of breathing in (aspirating) secretions their own mouth and nose.   People who havea weakened immune system (immunocompromised).  People who smoke.  People whotravel to areas where pneumonia-causing germs commonly exist.  People whoare around animal habitats or animals that have pneumonia-causing germs, including birds, bats, rabbits, cats, and farm animals. SYMPTOMS Symptoms of this condition include:  Adry cough.  A wet (productive) cough.  Fever.  Sweating.  Chest pain, especially when breathing deeply or coughing.  Rapid breathing or difficulty  breathing.  Shortness of breath.  Shaking chills.  Fatigue.  Muscle aches. DIAGNOSIS Your health care provider will take a medical history and perform a physical exam. You may also have other tests, including:  Imaging studies of your chest, including X-rays.  Tests to check your blood oxygen level and other blood gases.  Other tests on blood, mucus (sputum), fluid around your lungs (pleural fluid), and urine. If your pneumonia is severe, other tests may be done to identify the specific cause of your illness. TREATMENT The type of treatment that you receive depends on many factors, such as the cause of your pneumonia, the medicines you take, and other medical conditions that you have. For most adults, treatment and recovery from pneumonia may occur at home. In some cases, treatment must happen in a hospital. Treatment may include:  Antibiotic medicines, if the pneumonia was caused by bacteria.  Antiviral medicines, if the pneumonia was caused by a virus.  Medicines that are given by mouth or through an IV tube.  Oxygen.  Respiratory therapy. Although rare, treating severe pneumonia may include:  Mechanical ventilation. This is done if you are not breathing well on your own and you cannot maintain a safe blood oxygen level.  Thoracentesis. This procedureremoves fluid around one lung or both lungs to help you breathe better. HOME CARE INSTRUCTIONS  Take over-the-counter and prescription medicines only as told by your health care provider.  Only takecough medicine if you are losing sleep. Understand that cough medicine can prevent your body's natural ability to remove mucus from your lungs.  If you were prescribed an antibiotic medicine, take it as told by your health care provider. Do not stop taking the antibiotic even if you start to feel better.  Sleep in a semi-upright position at night. Try sleeping in a reclining chair, or place a few pillows under your head.  Do  not use tobacco products, including cigarettes, chewing tobacco, and e-cigarettes. If you need help quitting, ask your health care provider.  Drink enough water to keep your urine clear or pale yellow. This will help to thin out mucus secretions in your lungs. PREVENTION There are ways that you can decrease your risk of developing community-acquired pneumonia. Consider getting a pneumococcal vaccine if:  You are older than 43 years of age.  You are older than 44 years of age and are undergoing cancer treatment, have chronic lung disease, or have other medical conditions that affect your immune system. Ask your health care provider if this applies to you. There are different types and schedules of pneumococcal vaccines. Ask your health care provider which vaccination option is best for you. You may also prevent community-acquired pneumonia if you take these actions:  Get an influenza vaccine every year. Ask your health care provider which type of influenza vaccine is best for you.  Go to the dentist on a regular basis.  Wash your hands often. Use hand sanitizer if soap and water are not available. SEEK MEDICAL CARE IF:  You have a fever.  You are losing sleep because you cannot control your cough with cough medicine. SEEK IMMEDIATE MEDICAL CARE IF:  You have worsening shortness of breath.  You have increased chest pain.  Your sickness becomes worse, especially if you are an older adult or have a weakened immune system.  You cough up blood.   This information is not intended to replace advice given to you by your health care provider. Make sure you discuss any questions you have with your health care provider.   Document Released: 11/03/2005 Document Revised: 07/25/2015 Document Reviewed: 02/28/2015 Elsevier Interactive Patient Education [employer].

## 2016-02-14 NOTE — Progress Notes (Signed)
   HPI  Patient presents today here with cough and cold symptoms.  Patient explains that over the last 2 weeks she's had chest congestion, nasal congestion, and cough. His cough is slowly progressed from a productive cough to a dry nagging cough now. He has mild burning chest pain with cough. He feels unwell with malaise and subjective fever. He's tolerating food and fluids normally. He has mild shortness of breath. He has had minimal improvement in the cough with Mucinex DM  PMH: Smoking status noted ROS: Per HPI  Objective: BP 131/93 mmHg  Pulse 63  Temp(Src) 97.2 F (36.2 C) (Oral)  Ht 6\' 3"  (1.905 m)  Wt 243 lb (110.224 kg)  BMI 30.37 kg/m2 Gen: NAD, alert, cooperative with exam HEENT: NCAT, large swollen tonsils, minimal exudate, TM on the right is erythematous with loss of landmarks, left TM is normal CV: RRR, good S1/S2, no murmur Resp: Coarse sounds more prominent in the left lower lobe but heard throughout, good air movement, scattered wheeze Ext: No edema, warm Neuro: Alert and oriented, No gross deficits  Assessment and plan:  # Community-acquired pneumonia Clinical diagnosis, no chest x-ray performed Given course of illness, lung findings, and recent exposure to low-dose amoxicillin I will go ahead and treat with Levaquin. With wheezing and chronic dry cough suspicious for bronchitic cough I will go ahead and give IM Kenalog Supportive care discussed Return to clinic with any concerns regarding his expected   Meds ordered this encounter  Medications  . levofloxacin (LEVAQUIN) 500 MG tablet    Sig: Take 1 tablet (500 mg total) by mouth daily.    Dispense:  7 tablet    Refill:  0    Laroy Apple, MD [facility] 02/14/2016, 8:18 AM

## 2016-02-26 ENCOUNTER — Other Ambulatory Visit: Payer: Self-pay

## 2016-02-26 DIAGNOSIS — R7989 Other specified abnormal findings of blood chemistry: Secondary | ICD-10-CM

## 2016-02-27 ENCOUNTER — Other Ambulatory Visit: Payer: 59

## 2016-02-27 DIAGNOSIS — R7989 Other specified abnormal findings of blood chemistry: Secondary | ICD-10-CM

## 2016-02-27 DIAGNOSIS — R946 Abnormal results of thyroid function studies: Secondary | ICD-10-CM | POA: Diagnosis not present

## 2016-02-28 ENCOUNTER — Encounter: Payer: Self-pay | Admitting: *Deleted

## 2016-02-28 ENCOUNTER — Other Ambulatory Visit: Payer: Self-pay | Admitting: *Deleted

## 2016-02-28 LAB — CMP14+EGFR
ALT: 36 IU/L (ref 0–44)
AST: 25 IU/L (ref 0–40)
Albumin/Globulin Ratio: 1.6 (ref 1.2–2.2)
Albumin: 4.3 g/dL (ref 3.5–5.5)
Alkaline Phosphatase: 76 IU/L (ref 39–117)
BILIRUBIN TOTAL: 0.3 mg/dL (ref 0.0–1.2)
BUN/Creatinine Ratio: 16 (ref 9–20)
BUN: 14 mg/dL (ref 6–24)
CALCIUM: 9.5 mg/dL (ref 8.7–10.2)
CHLORIDE: 101 mmol/L (ref 96–106)
CO2: 27 mmol/L (ref 18–29)
Creatinine, Ser: 0.87 mg/dL (ref 0.76–1.27)
GFR, EST AFRICAN AMERICAN: 122 mL/min/{1.73_m2} (ref >59)
GFR, EST NON AFRICAN AMERICAN: 106 mL/min/{1.73_m2} (ref >59)
GLUCOSE: 102 mg/dL — AB (ref 65–99)
Globulin, Total: 2.7 g/dL (ref 1.5–4.5)
Potassium: 4.3 mmol/L (ref 3.5–5.2)
Sodium: 143 mmol/L (ref 134–144)
TOTAL PROTEIN: 7 g/dL (ref 6.0–8.5)

## 2016-02-28 LAB — TSH: TSH: 5.69 u[IU]/mL — AB (ref 0.450–4.500)

## 2016-02-28 MED ORDER — LEVOTHYROXINE SODIUM 50 MCG PO TABS: 50.0000 ug | ORAL_TABLET | Freq: Every day | ORAL | Status: DC

## 2016-02-28 NOTE — Telephone Encounter (Signed)
Spoke with Dr.Bradshaw and he advised to send in rx for 47mcg levothyroxine 1 PO every day. Rx sent in and pt is aware.

## 2016-08-26 ENCOUNTER — Encounter: Payer: Self-pay | Admitting: Family Medicine

## 2016-08-26 ENCOUNTER — Ambulatory Visit (INDEPENDENT_AMBULATORY_CARE_PROVIDER_SITE_OTHER): Payer: 59 | Admitting: Family Medicine

## 2016-08-26 VITALS — BP 124/92 | HR 72 | Temp 98.1°F | Ht 75.0 in | Wt 240.0 lb

## 2016-08-26 DIAGNOSIS — M545 Low back pain, unspecified: Secondary | ICD-10-CM

## 2016-08-26 DIAGNOSIS — Z23 Encounter for immunization: Secondary | ICD-10-CM

## 2016-08-26 MED ORDER — METHYLPREDNISOLONE ACETATE 80 MG/ML IJ SUSP
80.0000 mg | Freq: Once | INTRAMUSCULAR | Status: AC
  Administered 2016-08-26: 80 mg via INTRAMUSCULAR

## 2016-08-26 NOTE — Progress Notes (Signed)
   Subjective:    Patient ID: Steve Taylor, male    DOB: [birth year], 45 y.o.   MRN: [id number]  HPI 45 year old gentleman with intermittent right sided back pain with radiation to the right hip. It extends no further down his leg and in to his hip. This feels much like the pain he has had before. He thinks it was brought on by wrestling with his son over the weekend.  Patient Active Problem List   Diagnosis Date Noted  . Abdominal pain 11/23/2015  . Other dysphagia 06/05/2014  . Intermittent low back pain 03/30/2014   Outpatient Encounter Prescriptions as of 08/26/2016  Medication Sig  . [DISCONTINUED] levofloxacin (LEVAQUIN) 500 MG tablet Take 1 tablet (500 mg total) by mouth daily.  . [DISCONTINUED] levothyroxine (SYNTHROID, LEVOTHROID) 50 MCG tablet Take 1 tablet (50 mcg total) by mouth daily.   No facility-administered encounter medications on file as of 08/26/2016.       Review of Systems  Constitutional: Negative.   Cardiovascular: Negative.   Musculoskeletal: Positive for back pain.  Psychiatric/Behavioral: Negative.        Objective:   Physical Exam  Constitutional: He appears well-developed and well-nourished.  Cardiovascular: Normal rate, regular rhythm and normal heart sounds.   Pulmonary/Chest: Effort normal.  Musculoskeletal:  Back: Normal range of motion Straight leg raising negative Deep tendon reflexes are symmetric Normal heel and toe walking   BP (!) 132/93   Pulse 67   Temp 98.1 F (36.7 C) (Oral)   Ht 6\' 3"  (1.905 m)   Wt 240 lb (108.9 kg)   BMI 30.00 kg/m        Assessment & Plan:  1. Acute right-sided low back pain without sciatica We'll try injection again. If this does not help consider scanning.methylPREDNISolone acetate (DEPO-MEDROL) injection 80 mg; Inject 1 mL (80 mg total) into the muscle once.  . Encounter for immunization  - Flu Vaccine QUAD 36+ mos IM  Wardell Honour MD

## 2017-01-06 ENCOUNTER — Ambulatory Visit (INDEPENDENT_AMBULATORY_CARE_PROVIDER_SITE_OTHER): Payer: 59 | Admitting: Family Medicine

## 2017-01-06 ENCOUNTER — Encounter: Payer: Self-pay | Admitting: Family Medicine

## 2017-01-06 VITALS — BP 126/89 | HR 65 | Temp 97.4°F | Ht 75.0 in | Wt 227.4 lb

## 2017-01-06 DIAGNOSIS — E039 Hypothyroidism, unspecified: Secondary | ICD-10-CM | POA: Insufficient documentation

## 2017-01-06 DIAGNOSIS — Z0000 Encounter for general adult medical examination without abnormal findings: Secondary | ICD-10-CM

## 2017-01-06 HISTORY — DX: Hypothyroidism, unspecified: E03.9

## 2017-01-06 NOTE — Progress Notes (Signed)
   HPI  Patient presents today for annual physical exam and hypothyroidism.  Annual exam Patient feels well, he's recently lost his job and is very anxious about this. He is working hard to find a new job. He feels very reassured that his family is healthy and that he has everything he needs currently.  Hypothyroidism Patient with some night sweats recently, however this seems to be more related to anxiety and loss of the job. Patient not taking Synthroid in many months.  PMH: Smoking status noted ROS: Per HPI  Objective: BP 126/89   Pulse 65   Temp 97.4 F (36.3 C) (Oral)   Ht '6\' 3"'$  (1.905 m)   Wt 227 lb 6.4 oz (103.1 kg)   BMI 28.42 kg/m  Gen: NAD, alert, cooperative with exam HEENT: NCAT, oropharynx clear and moist, EOMI, PERRLA CV: RRR, good S1/S2, no murmur Resp: CTABL, no wheezes, non-labored Abd: SNTND, BS present, no guarding or organomegaly Ext: No edema, warm Neuro: Alert and oriented, strength 5/5 and sensation intact bilaterally.  Assessment and plan:  # Annual physical exam Normal exam except for slightly overweight The biggest obstacle patient dealing with currently is loss of his job, I think he is doing very well despite his difficult season in life.  Labs checked fasting this a.m. Patient will restart Synthroid.    Orders Placed This Encounter  Procedures  . CMP14+EGFR  . CBC with Differential/Platelet  . TSH  . Lipid Panel     Laroy Apple, MD [facility] 01/06/2017, 11:47 AM

## 2017-01-07 LAB — CBC WITH DIFFERENTIAL/PLATELET
BASOS ABS: 0.2 10*3/uL (ref 0.0–0.2)
Basos: 2 %
EOS (ABSOLUTE): 0.4 10*3/uL (ref 0.0–0.4)
Eos: 4 %
Hematocrit: 44.4 % (ref 37.5–51.0)
Hemoglobin: 14.8 g/dL (ref 13.0–17.7)
IMMATURE GRANS (ABS): 0 10*3/uL (ref 0.0–0.1)
IMMATURE GRANULOCYTES: 0 %
LYMPHS: 36 %
Lymphocytes Absolute: 3.4 10*3/uL — ABNORMAL HIGH (ref 0.7–3.1)
MCH: 29.4 pg (ref 26.6–33.0)
MCHC: 33.3 g/dL (ref 31.5–35.7)
MCV: 88 fL (ref 79–97)
MONOS ABS: 0.7 10*3/uL (ref 0.1–0.9)
Monocytes: 7 %
NEUTROS PCT: 51 %
Neutrophils Absolute: 4.9 10*3/uL (ref 1.4–7.0)
PLATELETS: 407 10*3/uL — AB (ref 150–379)
RBC: 5.04 x10E6/uL (ref 4.14–5.80)
RDW: 14.3 % (ref 12.3–15.4)
WBC: 9.5 10*3/uL (ref 3.4–10.8)

## 2017-01-07 LAB — LIPID PANEL
CHOL/HDL RATIO: 4.3 (ref 0.0–5.0)
Cholesterol, Total: 153 mg/dL (ref 100–199)
HDL: 36 mg/dL — AB (ref >39)
LDL Calculated: 90 (ref 0–99)
TRIGLYCERIDES: 135 mg/dL (ref 0–149)
VLDL Cholesterol Cal: 27 (ref 5–40)

## 2017-01-07 LAB — CMP14+EGFR
A/G RATIO: 1.8 (ref 1.2–2.2)
ALK PHOS: 71 IU/L (ref 39–117)
ALT: 27 IU/L (ref 0–44)
AST: 18 IU/L (ref 0–40)
Albumin: 4.2 g/dL (ref 3.5–5.5)
BILIRUBIN TOTAL: 0.5 mg/dL (ref 0.0–1.2)
BUN/Creatinine Ratio: 12 (ref 9–20)
BUN: 12 mg/dL (ref 6–24)
CHLORIDE: 103 mmol/L (ref 96–106)
CO2: 22 mmol/L (ref 18–29)
Calcium: 9.4 mg/dL (ref 8.7–10.2)
Creatinine, Ser: 1.01 mg/dL (ref 0.76–1.27)
GFR calc Af Amer: 104 (ref >59)
GFR calc non Af Amer: 90 (ref >59)
GLOBULIN, TOTAL: 2.4 (ref 1.5–4.5)
Glucose: 106 mg/dL — ABNORMAL HIGH (ref 65–99)
POTASSIUM: 4.1 mmol/L (ref 3.5–5.2)
SODIUM: 144 mmol/L (ref 134–144)
Total Protein: 6.6 g/dL (ref 6.0–8.5)

## 2017-01-07 LAB — TSH: TSH: 6.93 u[IU]/mL — AB (ref 0.450–4.500)

## 2017-01-09 ENCOUNTER — Telehealth: Payer: Self-pay | Admitting: Family Medicine

## 2017-01-09 LAB — SPECIMEN STATUS REPORT

## 2017-01-09 LAB — HGB A1C W/O EAG: Hgb A1c MFr Bld: 5.7 % — ABNORMAL HIGH (ref 4.8–5.6)

## 2017-01-09 NOTE — Telephone Encounter (Signed)
Called to discuss pre-diabetes, will call Monday.   Left VM  Laroy Apple, MD [facility] 01/09/2017, 5:12 PM

## 2017-01-12 ENCOUNTER — Telehealth: Payer: Self-pay | Admitting: Family Medicine

## 2017-01-12 NOTE — Telephone Encounter (Signed)
Entered in error

## 2017-01-12 NOTE — Telephone Encounter (Signed)
Called and discussed pre-diabetes.   Discussed theraputic lifestyle changes, borderline A1C so mainly recommended reducing sugar sweetened beverages  Repeat in 4 months  Offered clincal pharmacists for formal diabetic education but he will defer for now.   Laroy Apple, MD [facility] 01/12/2017, 5:30 PM

## 2017-01-27 ENCOUNTER — Other Ambulatory Visit: Payer: Self-pay | Admitting: Family Medicine

## 2017-01-27 MED ORDER — LEVOTHYROXINE SODIUM 50 MCG PO TABS: 50.0000 ug | ORAL_TABLET | Freq: Every day | ORAL | 3 refills | Status: DC

## 2017-01-27 MED FILL — LEVOTHYROXINE 50 MCG TABLET: 50 | 90 days supply | Qty: 90 | Fill #0 | Status: TO

## 2017-01-27 NOTE — Telephone Encounter (Signed)
.  What is the name of the medication? Thyroid med  Have you contacted your pharmacy to request a refill? no  Which pharmacy would you like this sent to? [facility]   Patient notified that their request is being sent to the clinical staff for review and that they should receive a call once it is complete. If they do not receive a call within 24 hours they can check with their pharmacy or our office.

## 2017-01-27 NOTE — Telephone Encounter (Signed)
done

## 2017-05-11 MED FILL — LEVOTHYROXINE 50 MCG TABLET: 50 | 90 days supply | Qty: 90 | Fill #0

## 2017-06-02 ENCOUNTER — Ambulatory Visit (INDEPENDENT_AMBULATORY_CARE_PROVIDER_SITE_OTHER): Payer: 59 | Admitting: Family Medicine

## 2017-06-02 ENCOUNTER — Encounter: Payer: Self-pay | Admitting: Family Medicine

## 2017-06-02 VITALS — BP 132/87 | HR 64 | Temp 97.4°F | Ht 75.0 in | Wt 238.4 lb

## 2017-06-02 DIAGNOSIS — L0291 Cutaneous abscess, unspecified: Secondary | ICD-10-CM | POA: Diagnosis not present

## 2017-06-02 DIAGNOSIS — L723 Sebaceous cyst: Secondary | ICD-10-CM | POA: Diagnosis not present

## 2017-06-02 MED ORDER — CEPHALEXIN 500 MG PO CAPS: 500.0000 mg | ORAL_CAPSULE | Freq: Three times a day (TID) | ORAL | 0 refills | Status: DC

## 2017-06-02 NOTE — Patient Instructions (Signed)
Great to see you!  Keep the area covered with a  Simple dressing for 3-4 days.   Do not soak in a tub or swim until it is closed.

## 2017-06-02 NOTE — Progress Notes (Signed)
   HPI  Patient presents today here with a sore lesion under the right arm.  Patient states symptoms started this morning with irritation of pain under the right arm, when he felt the area he noticed a large cystlike structure. He denies fever, chills, sweats.  He would like the area removed surgically if possible.  He has another area on the low back and neck comes and goes with a small amount of pus this Jerelene Redden very foul. He states that he is interested in getting this removed. It happens about every 1 or 2 months.  PMH: Smoking status noted ROS: Per HPI  Objective: BP 132/87   Pulse 64   Temp (!) 97.4 F (36.3 C) (Oral)   Ht 6\' 3"  (1.905 m)   Wt 238 lb 6.4 oz (108.1 kg)   BMI 29.80 kg/m  Gen: NAD, alert, cooperative with exam HEENT: NCAT CV: RRR, good S1/S2, no murmur Resp: CTABL, no wheezes, non-labored Ext: No edema, warm Neuro: Alert and oriented, No gross deficits  Skin Approximately 8 cm distal to the axilla patient with 3 cm x 2 cm erythematous area with induration and moderate fluctuance. It is tender to palpation.  Right lower back with small area of concern, proximally 3-4 mm in diameter subcutaneous nodule with no tenderness to palpation.  Incision and drainage Area was cleaned with alcohol and analgesia achieved using one mL of 2% Xylocaine with epinephrine. Area was then cleaned with Betadine 1 and wiped clear with alcohol. Using an 11 blade a small 8-10 mm incision over the top of the lesion was made producing a small amount of pus. The area was explored using a hemostat with no septations found. I palpated the area to express as much pus as possible. Sterile bandage placed, patient tolerated procedure very well.  Assessment and plan:  # Abscess Incision and drainage as above Keflex   # Sebaceous cyst Right lower back with recurrent sebaceous cyst. Refer to dermatology for complete excision.   Orders Placed This Encounter  Procedures  .  Ambulatory referral to Dermatology    Referral Priority:   Routine    Referral Type:   Consultation    Referral Reason:   Specialty Services Required    Requested Specialty:   Dermatology    Number of Visits Requested:   1    Meds ordered this encounter  Medications  . DISCONTD: cephALEXin (KEFLEX) 500 MG capsule    Sig: Take 1 capsule (500 mg total) by mouth 3 (three) times daily.    Dispense:  21 capsule    Refill:  0  . cephALEXin (KEFLEX) 500 MG capsule    Sig: Take 1 capsule (500 mg total) by mouth 3 (three) times daily.    Dispense:  21 capsule    Refill:  [place], MD [facility] 06/02/2017, 12:02 PM   \

## 2018-03-25 ENCOUNTER — Encounter: Payer: Self-pay | Admitting: Family Medicine

## 2018-03-25 ENCOUNTER — Ambulatory Visit (INDEPENDENT_AMBULATORY_CARE_PROVIDER_SITE_OTHER): Payer: 59 | Admitting: Family Medicine

## 2018-03-25 ENCOUNTER — Other Ambulatory Visit: Payer: Self-pay

## 2018-03-25 ENCOUNTER — Emergency Department (HOSPITAL_COMMUNITY)
Admission: EM | Admit: 2018-03-25 | Discharge: 2018-03-25 | Disposition: A | Discharge status: Home / Self Care | Payer: 59 | Attending: Emergency Medicine | Admitting: Emergency Medicine

## 2018-03-25 ENCOUNTER — Emergency Department (HOSPITAL_COMMUNITY): Payer: 59

## 2018-03-25 ENCOUNTER — Ambulatory Visit: Payer: 59 | Admitting: Family Medicine

## 2018-03-25 ENCOUNTER — Encounter (HOSPITAL_COMMUNITY): Payer: Self-pay | Admitting: Emergency Medicine

## 2018-03-25 VITALS — BP 147/99 | HR 82 | Temp 97.3°F | Ht 75.0 in | Wt 241.2 lb

## 2018-03-25 DIAGNOSIS — Z5321 Procedure and treatment not carried out due to patient leaving prior to being seen by health care provider: Secondary | ICD-10-CM | POA: Insufficient documentation

## 2018-03-25 DIAGNOSIS — R079 Chest pain, unspecified: Secondary | ICD-10-CM | POA: Insufficient documentation

## 2018-03-25 DIAGNOSIS — R0789 Other chest pain: Secondary | ICD-10-CM | POA: Diagnosis not present

## 2018-03-25 DIAGNOSIS — R002 Palpitations: Secondary | ICD-10-CM

## 2018-03-25 LAB — CBC
HCT: 50.2 % (ref 39.0–52.0)
HEMOGLOBIN: 17.3 g/dL — AB (ref 13.0–17.0)
MCH: 31.2 pg (ref 26.0–34.0)
MCHC: 34.5 g/dL (ref 30.0–36.0)
MCV: 90.6 fL (ref 78.0–100.0)
Platelets: 393 10*3/uL (ref 150–400)
RBC: 5.54 MIL/uL (ref 4.22–5.81)
RDW: 14.8 % (ref 11.5–15.5)
WBC: 12.7 10*3/uL — ABNORMAL HIGH (ref 4.0–10.5)

## 2018-03-25 LAB — BASIC METABOLIC PANEL
Anion gap: 7 (ref 5–15)
BUN: 10 mg/dL (ref 6–20)
CHLORIDE: 109 mmol/L (ref 101–111)
CO2: 26 mmol/L (ref 22–32)
Calcium: 9.5 mg/dL (ref 8.9–10.3)
Creatinine, Ser: 1.13 mg/dL (ref 0.61–1.24)
GFR calc non Af Amer: >60 mL/min (ref >60)
GLUCOSE: 149 mg/dL — AB (ref 65–99)
Potassium: 3.5 mmol/L (ref 3.5–5.1)
Sodium: 142 mmol/L (ref 135–145)

## 2018-03-25 LAB — I-STAT TROPONIN, ED: Troponin i, poc: 0 ng/mL (ref 0.00–0.08)

## 2018-03-25 MED ORDER — METOPROLOL SUCCINATE ER 50 MG PO TB24: 50.0000 mg | ORAL_TABLET | Freq: Every day | ORAL | 3 refills | Status: DC

## 2018-03-25 NOTE — ED Triage Notes (Signed)
Patient with chest pain and nausea that started around 11pm this evening.  Patient states his stomach is very upset, feels like he will vomit, but has not at this time.  Patient has no cardiac history.  He states that his heart was racing first before having the chest pain.

## 2018-03-25 NOTE — Progress Notes (Signed)
   HPI  Patient presents today after an episode of palpitations and chest pain last night.  Patient explains that late last night he had an episode of racing heart.  He states that at the same time he began having upset stomach. He was concerned that he may be having a heart problem and went to the emergency room. His heart began to get better and he had an EKG.  He started having a small amount of left upper chest chest tightness.  This has continued since last night but is getting better.  After waiting in the ER about 3 hours he and his wife left.  Patient states he still having a little bit of chest tightness, however he is getting much better.  It is not exertional. He does not have any associated shortness of breath, sweating  PMH: Smoking status noted ROS: Per HPI  Objective: BP (!) 147/99   Pulse 82   Temp (!) 97.3 F (36.3 C) (Oral)   Ht 6\' 3"  (1.905 m)   Wt 241 lb 3.2 oz (109.4 kg)   SpO2 96%   BMI 30.15 kg/m  Gen: NAD, alert, cooperative with exam HEENT: NCAT CV: RRR, good S1/S2, no murmur Resp: CTABL, no wheezes, non-labored Ext: No edema, warm Neuro: Alert and oriented, No gross deficits  Assessment and plan:  #Palpitations, atypical chest pain Patient with episode of racing heart without symptoms, as it resolved he began to have chest tightness.  EKG in the ED is nonacute. Troponin, BMP, CBC all largely okay. Patient had normal cholesterol last year and slightly underfunctioning thyroid, he has not been taking Synthroid. He has significant family history with his mother having heart disease in her 67s now with pacemaker. His grandfather also had CAD, however onset of age is not clear. Patient is not a smoker His blood pressure is elevated today, however the previous several visits have been normal with some intermittent elevated diastolics. Starting Toprol today Offered Holter monitor versus cardiology, he will go ahead and go to cardiology.  Referral  placed.    Orders Placed This Encounter  Procedures  . Lipid panel  . TSH  . Ambulatory referral to Cardiology    Referral Priority:   Urgent    Referral Type:   Consultation    Referral Reason:   Specialty Services Required    Requested Specialty:   Cardiology    Number of Visits Requested:   1    Meds ordered this encounter  Medications  . metoprolol succinate (TOPROL-XL) 50 MG 24 hr tablet    Sig: Take 1 tablet (50 mg total) by mouth daily. Take with or immediately following a meal.    Dispense:  90 tablet    Refill:  Farley, MD [facility] 03/25/2018, 1:20 PM

## 2018-03-25 NOTE — ED Notes (Signed)
Pt turned labels into registration at front desk. Pt is leaving and seen exiting lobby to parking lot.

## 2018-03-25 NOTE — Patient Instructions (Signed)
Great to see you!  Start toprol 1 pill once daily   You will be called for a cardiology appointment    Chest Pain Chest pain can be caused by many different conditions. There is a chance that your pain could be related to something serious, such as a heart attack or a blood clot in your lungs. Chest pain can also be caused by conditions that are not life-threatening. If you have chest pain, it is very important to follow up with your doctor. Follow these instructions at home: Medicines  If you were prescribed an antibiotic medicine, take it as told by your doctor. Do not stop taking the antibiotic even if you start to feel better.  Take over-the-counter and prescription medicines only as told by your doctor. Lifestyle  Do not use any products that contain nicotine or tobacco, such as cigarettes and e-cigarettes. If you need help quitting, ask your doctor.  Do not drink alcohol.  Make lifestyle changes as told by your doctor. These may include: ? Getting regular exercise. Ask your doctor for some activities that are safe for you. ? Eating a heart-healthy diet. A diet specialist (dietitian) can help you to learn healthy eating options. ? Staying at a healthy weight. ? Managing diabetes, if needed. ? Lowering your stress, as with deep breathing or spending time in nature. General instructions  Avoid any activities that make you feel chest pain.  If your chest pain is because of heartburn: ? Raise (elevate) the head of your bed about 6 inches (15 cm). You can do this by putting blocks under the bed legs at the head of the bed. ? Do not sleep with extra pillows under your head. That does not help heartburn.  Keep all follow-up visits as told by your doctor. This is important. This includes any further testing if your chest pain does not go away. Contact a doctor if:  Your chest pain does not go away.  You have a rash with blisters on your chest.  You have a fever.  You have  chills. Get help right away if:  Your chest pain is worse.  You have a cough that gets worse, or you cough up blood.  You have very bad (severe) pain in your belly (abdomen).  You are very weak.  You pass out (faint).  You have either of these for no clear reason: ? Sudden chest discomfort. ? Sudden discomfort in your arms, back, neck, or jaw.  You have shortness of breath at any time.  You suddenly start to sweat, or your skin gets clammy.  You feel sick to your stomach (nauseous).  You throw up (vomit).  You suddenly feel light-headed or dizzy.  Your heart starts to beat fast, or it feels like it is skipping beats. These symptoms may be an emergency. Do not wait to see if the symptoms will go away. Get medical help right away. Call your local emergency services (911 in the [place] Do not drive yourself to the hospital. This information is not intended to replace advice given to you by your health care provider. Make sure you discuss any questions you have with your health care provider. Document Released: 04/21/2008 Document Revised: 07/28/2016 Document Reviewed: 07/28/2016 Elsevier Interactive Patient Education  2017 [employer].

## 2018-03-26 ENCOUNTER — Ambulatory Visit: Payer: 59 | Admitting: Family Medicine

## 2018-03-26 LAB — LIPID PANEL
CHOLESTEROL TOTAL: 209 mg/dL — AB (ref 100–199)
Chol/HDL Ratio: 5.5 ratio — ABNORMAL HIGH (ref 0.0–5.0)
HDL: 38 mg/dL — AB (ref >39)
LDL CALC: 139 mg/dL — AB (ref 0–99)
TRIGLYCERIDES: 161 mg/dL — AB (ref 0–149)
VLDL CHOLESTEROL CAL: 32 mg/dL (ref 5–40)

## 2018-03-26 LAB — TSH: TSH: 5.74 u[IU]/mL — ABNORMAL HIGH (ref 0.450–4.500)

## 2018-04-19 ENCOUNTER — Encounter: Payer: 59 | Admitting: Family Medicine

## 2018-05-11 ENCOUNTER — Ambulatory Visit: Payer: 59 | Admitting: Cardiology

## 2018-05-13 ENCOUNTER — Ambulatory Visit (INDEPENDENT_AMBULATORY_CARE_PROVIDER_SITE_OTHER): Payer: 59 | Admitting: Family Medicine

## 2018-05-13 ENCOUNTER — Encounter: Payer: Self-pay | Admitting: Family Medicine

## 2018-05-13 VITALS — BP 139/90 | HR 73 | Temp 97.3°F | Ht 75.0 in | Wt 246.6 lb

## 2018-05-13 DIAGNOSIS — Z0000 Encounter for general adult medical examination without abnormal findings: Secondary | ICD-10-CM | POA: Diagnosis not present

## 2018-05-13 NOTE — Progress Notes (Signed)
   HPI  Patient presents today here for annual physical exam.  He continues to have intermittent palpitations but had to reschedule his cardiology appointment due to work.  Patient is reasonably active. He does not really watch his diet. He is not fasting today.  He is doing well at work, he is recently returned from vacation.  PMH: Smoking status noted ROS: Per HPI  Objective: BP 139/90   Pulse 73   Temp (!) 97.3 F (36.3 C) (Oral)   Ht 6\' 3"  (1.905 m)   Wt 246 lb 9.6 oz (111.9 kg)   BMI 30.82 kg/m  Gen: NAD, alert, cooperative with exam HEENT: NCAT, EOMI, PERRL CV: RRR, good S1/S2, no murmur Resp: CTABL, no wheezes, non-labored Abd: SNTND, BS present, no guarding or organomegaly Ext: No edema, warm Neuro: Alert and oriented, No gross deficits  Assessment and plan:  #Annual physical exam Normal exam except for weight, however he has a muscular build and BMI is likely not reflective of his true state of health. Recent labs from the emergency room reviewed, repeat CBC, A1c Testosterone checked due to his wife's concerns, no decreased sex drive or ED.   Orders Placed This Encounter  Procedures  . CBC with Differential/Platelet  . Testosterone  . TSH  . [facility] Hb A1c Carney Bern, MD [facility] 05/13/2018, 11:26 AM

## 2018-05-13 NOTE — Patient Instructions (Signed)
Great to see you!   Health Maintenance, Male A healthy lifestyle and preventive care is important for your health and wellness. Ask your health care provider about what schedule of regular examinations is right for you. What should I know about weight and diet? Eat a Healthy Diet  Eat plenty of vegetables, fruits, whole grains, low-fat dairy products, and lean protein.  Do not eat a lot of foods high in solid fats, added sugars, or salt.  Maintain a Healthy Weight Regular exercise can help you achieve or maintain a healthy weight. You should:  Do at least 150 minutes of exercise each week. The exercise should increase your heart rate and make you sweat (moderate-intensity exercise).  Do strength-training exercises at least twice a week.  Watch Your Levels of Cholesterol and Blood Lipids  Have your blood tested for lipids and cholesterol every 5 years starting at 45 years of age. If you are at high risk for heart disease, you should start having your blood tested when you are 45 years old. You may need to have your cholesterol levels checked more often if: ? Your lipid or cholesterol levels are high. ? You are older than 45 years of age. ? You are at high risk for heart disease.  What should I know about cancer screening? Many types of cancers can be detected early and may often be prevented. Lung Cancer  You should be screened every year for lung cancer if: ? You are a current smoker who has smoked for at least 30 years. ? You are a former smoker who has quit within the past 15 years.  Talk to your health care provider about your screening options, when you should start screening, and how often you should be screened.  Colorectal Cancer  Routine colorectal cancer screening usually begins at 45 years of age and should be repeated every 5-10 years until you are 45 years old. You may need to be screened more often if early forms of precancerous polyps or small growths are found.  Your health care provider may recommend screening at an earlier age if you have risk factors for colon cancer.  Your health care provider may recommend using home test kits to check for hidden blood in the stool.  A small camera at the end of a tube can be used to examine your colon (sigmoidoscopy or colonoscopy). This checks for the earliest forms of colorectal cancer.  Prostate and Testicular Cancer  Depending on your age and overall health, your health care provider may do certain tests to screen for prostate and testicular cancer.  Talk to your health care provider about any symptoms or concerns you have about testicular or prostate cancer.  Skin Cancer  Check your skin from head to toe regularly.  Tell your health care provider about any new moles or changes in moles, especially if: ? There is a change in a mole's size, shape, or color. ? You have a mole that is larger than a pencil eraser.  Always use sunscreen. Apply sunscreen liberally and repeat throughout the day.  Protect yourself by wearing long sleeves, pants, a wide-brimmed hat, and sunglasses when outside.  What should I know about heart disease, diabetes, and high blood pressure?  If you are 18-39 years of age, have your blood pressure checked every 3-5 years. If you are 40 years of age or older, have your blood pressure checked every year. You should have your blood pressure measured twice-once when you are at a   hospital or clinic, and once when you are not at a hospital or clinic. Record the average of the two measurements. To check your blood pressure when you are not at a hospital or clinic, you can use: ? An automated blood pressure machine at a pharmacy. ? A home blood pressure monitor.  Talk to your health care provider about your target blood pressure.  If you are between 45-79 years old, ask your health care provider if you should take aspirin to prevent heart disease.  Have regular diabetes screenings by  checking your fasting blood sugar level. ? If you are at a normal weight and have a low risk for diabetes, have this test once every three years after the age of 45. ? If you are overweight and have a high risk for diabetes, consider being tested at a younger age or more often.  A one-time screening for abdominal aortic aneurysm (AAA) by ultrasound is recommended for men aged 65-75 years who are current or former smokers. What should I know about preventing infection? Hepatitis B If you have a higher risk for hepatitis B, you should be screened for this virus. Talk with your health care provider to find out if you are at risk for hepatitis B infection. Hepatitis C Blood testing is recommended for:  Everyone born from 1945 through 1965.  Anyone with known risk factors for hepatitis C.  Sexually Transmitted Diseases (STDs)  You should be screened each year for STDs including gonorrhea and chlamydia if: ? You are sexually active and are younger than 45 years of age. ? You are older than 45 years of age and your health care provider tells you that you are at risk for this type of infection. ? Your sexual activity has changed since you were last screened and you are at an increased risk for chlamydia or gonorrhea. Ask your health care provider if you are at risk.  Talk with your health care provider about whether you are at high risk of being infected with HIV. Your health care provider may recommend a prescription medicine to help prevent HIV infection.  What else can I do?  Schedule regular health, dental, and eye exams.  Stay current with your vaccines (immunizations).  Do not use any tobacco products, such as cigarettes, chewing tobacco, and e-cigarettes. If you need help quitting, ask your health care provider.  Limit alcohol intake to no more than 2 drinks per day. One drink equals 12 ounces of beer, 5 ounces of wine, or 1 ounces of hard liquor.  Do not use street drugs.  Do not  share needles.  Ask your health care provider for help if you need support or information about quitting drugs.  Tell your health care provider if you often feel depressed.  Tell your health care provider if you have ever been abused or do not feel safe at home. This information is not intended to replace advice given to you by your health care provider. Make sure you discuss any questions you have with your health care provider. Document Released: 05/01/2008 Document Revised: 07/02/2016 Document Reviewed: 08/07/2015 Elsevier Interactive Patient Education  2018 Elsevier Inc.  

## 2018-05-24 ENCOUNTER — Encounter: Payer: Self-pay | Admitting: Cardiology

## 2018-06-01 ENCOUNTER — Telehealth: Payer: Self-pay | Admitting: Family Medicine

## 2018-06-01 MED ORDER — PANTOPRAZOLE SODIUM 40 MG PO TBEC: 40.0000 mg | DELAYED_RELEASE_TABLET | Freq: Every day | ORAL | 1 refills | Status: DC

## 2018-06-01 MED ORDER — METOPROLOL SUCCINATE ER 50 MG PO TB24: 50.0000 mg | ORAL_TABLET | Freq: Every day | ORAL | 3 refills | Status: DC

## 2018-06-01 MED FILL — SHIPPING COST: 1 days supply | Qty: 1 | Fill #0

## 2018-06-01 MED FILL — PANTOPRAZOLE SOD DR 40 MG T: 40 | 90 days supply | Qty: 90 | Fill #0

## 2018-06-01 MED FILL — METOPROLOL SUCCINATE ER 50: 50 | 90 days supply | Qty: 90 | Fill #0

## 2018-06-01 NOTE — Telephone Encounter (Signed)
Patient requesting Protonix for indigestion, has used a relatives and is doing better. Also needs refill of metoprolol.  Medication sent.  Laroy Apple, MD [facility] 06/01/2018, 4:25 PM

## 2018-06-10 ENCOUNTER — Telehealth: Payer: Self-pay | Admitting: Family Medicine

## 2018-06-10 DIAGNOSIS — R195 Other fecal abnormalities: Secondary | ICD-10-CM

## 2018-06-10 NOTE — Telephone Encounter (Signed)
discussed with is wife, pt with Hx of loose stools and daughter recently Dx with celiac, will screen.   Laroy Apple, MD [facility] 06/10/2018, 3:45 PM

## 2018-06-16 ENCOUNTER — Other Ambulatory Visit: Payer: 59

## 2018-06-16 DIAGNOSIS — Z0000 Encounter for general adult medical examination without abnormal findings: Secondary | ICD-10-CM | POA: Diagnosis not present

## 2018-06-16 DIAGNOSIS — R195 Other fecal abnormalities: Secondary | ICD-10-CM

## 2018-06-17 LAB — TISSUE TRANSGLUTAMINASE ABS,IGG,IGA: Transglutaminase IgA: <2 U/mL (ref 0–3)

## 2018-06-17 LAB — IGA: IgA/Immunoglobulin A, Serum: 283 mg/dL (ref 90–386)

## 2018-06-18 LAB — TSH: TSH: 7.18 u[IU]/mL — AB (ref 0.450–4.500)

## 2018-06-18 LAB — TESTOSTERONE: Testosterone: 202 ng/dL — ABNORMAL LOW (ref 264–916)

## 2018-06-23 ENCOUNTER — Other Ambulatory Visit: Payer: Self-pay | Admitting: Family Medicine

## 2018-06-23 MED ORDER — LEVOTHYROXINE SODIUM 100 MCG PO TABS: 100.0000 ug | ORAL_TABLET | Freq: Every day | ORAL | 0 refills | Status: DC

## 2018-06-23 MED FILL — LEVOTHYROXINE 100 MCG TAB: 100 | 90 days supply | Qty: 90 | Fill #0

## 2018-06-23 MED FILL — SHIPPING COST: 1 days supply | Qty: 1 | Fill #1

## 2018-07-08 ENCOUNTER — Ambulatory Visit: Payer: 59 | Admitting: Cardiology

## 2018-07-26 ENCOUNTER — Ambulatory Visit: Payer: 59 | Admitting: Cardiology

## 2018-08-05 ENCOUNTER — Ambulatory Visit (INDEPENDENT_AMBULATORY_CARE_PROVIDER_SITE_OTHER): Payer: 59 | Admitting: Family

## 2018-08-05 ENCOUNTER — Encounter: Payer: Self-pay | Admitting: Family

## 2018-08-05 VITALS — BP 107/73 | HR 95 | Temp 99.4°F | Ht 75.0 in | Wt 246.2 lb

## 2018-08-05 DIAGNOSIS — W57XXXA Bitten or stung by nonvenomous insect and other nonvenomous arthropods, initial encounter: Secondary | ICD-10-CM

## 2018-08-05 DIAGNOSIS — R509 Fever, unspecified: Secondary | ICD-10-CM

## 2018-08-05 DIAGNOSIS — J069 Acute upper respiratory infection, unspecified: Secondary | ICD-10-CM

## 2018-08-05 LAB — VERITOR FLU A/B WAIVED
INFLUENZA B: NEGATIVE
Influenza A: NEGATIVE

## 2018-08-05 LAB — RAPID STREP SCREEN (MED CTR MEBANE ONLY): STREP GP A AG, IA W/REFLEX: NEGATIVE

## 2018-08-05 LAB — CULTURE, GROUP A STREP

## 2018-08-05 MED ORDER — AZITHROMYCIN 250 MG PO TABS: ORAL_TABLET | ORAL | 0 refills | Status: DC

## 2018-08-05 MED ORDER — FLUTICASONE PROPIONATE 50 MCG/ACT NA SUSP: 2.0000 | Freq: Every day | NASAL | 6 refills | Status: DC

## 2018-08-05 NOTE — Patient Instructions (Signed)
Upper Respiratory Infection, Adult Most upper respiratory infections (URIs) are caused by a virus. A URI affects the nose, throat, and upper air passages. The most common type of URI is often called "the common cold." Follow these instructions at home:  Take medicines only as told by your doctor.  Gargle warm saltwater or take cough drops to comfort your throat as told by your doctor.  Use a warm mist humidifier or inhale steam from a shower to increase air moisture. This may make it easier to breathe.  Drink enough fluid to keep your pee (urine) clear or pale yellow.  Eat soups and other clear broths.  Have a healthy diet.  Rest as needed.  Go back to work when your fever is gone or your doctor says it is okay. ? You may need to stay home longer to avoid giving your URI to others. ? You can also wear a face mask and wash your hands often to prevent spread of the virus.  Use your inhaler more if you have asthma.  Do not use any tobacco products, including cigarettes, chewing tobacco, or electronic cigarettes. If you need help quitting, ask your doctor. Contact a doctor if:  You are getting worse, not better.  Your symptoms are not helped by medicine.  You have chills.  You are getting more short of breath.  You have brown or red mucus.  You have yellow or brown discharge from your nose.  You have pain in your face, especially when you bend forward.  You have a fever.  You have puffy (swollen) neck glands.  You have pain while swallowing.  You have white areas in the back of your throat. Get help right away if:  You have very bad or constant: ? Headache. ? Ear pain. ? Pain in your forehead, behind your eyes, and over your cheekbones (sinus pain). ? Chest pain.  You have long-lasting (chronic) lung disease and any of the following: ? Wheezing. ? Long-lasting cough. ? Coughing up blood. ? A change in your usual mucus.  You have a stiff neck.  You have  changes in your: ? Vision. ? Hearing. ? Thinking. ? Mood. This information is not intended to replace advice given to you by your health care provider. Make sure you discuss any questions you have with your health care provider. Document Released: 04/21/2008 Document Revised: 07/06/2016 Document Reviewed: 02/08/2014 Elsevier Interactive Patient Education  2018 Elsevier Inc.  

## 2018-08-05 NOTE — Progress Notes (Signed)
Subjective:    Patient ID: Steve Taylor, male    DOB: November 26, 1970, 47 y.o.   MRN: [id number]  Chief Complaint  Patient presents with  . Chills    pt here today c/o fever, chills, aches and feels terrible   Pt presents to the office today with fever and body aches. 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 States he had several chiggers bites two weeks ago, unsure of any recent tick bites. Denies any new rash, but is having fevers and "flu like symptoms".  Fever   This is a new problem. The current episode started yesterday. The problem occurs constantly. The maximum temperature noted was 99 to 99.9 F. Associated symptoms include congestion, headaches, muscle aches and sleepiness. Pertinent negatives include no coughing, ear pain, nausea, sore throat, vomiting or wheezing. He has tried acetaminophen and NSAIDs for the symptoms. The treatment provided mild relief.      Review of Systems  Constitutional: Positive for fever.  HENT: Positive for congestion. Negative for ear pain and sore throat.   Respiratory: Negative for cough and wheezing.   Gastrointestinal: Negative for nausea and vomiting.  Neurological: Positive for headaches.  All other systems reviewed and are negative.      Objective:   Physical Exam  Constitutional: He is oriented to person, place, and time. He appears well-developed and well-nourished. He appears ill. No distress.  HENT:  Head: Normocephalic.  Right Ear: External ear normal.  Left Ear: External ear normal.  Nose: Mucosal edema and rhinorrhea present.  Mouth/Throat: Posterior oropharyngeal erythema present.  Eyes: Pupils are equal, round, and reactive to light. Right eye exhibits no discharge. Left eye exhibits no discharge.  Neck: Normal range of motion. Neck supple. No thyromegaly present.  Cardiovascular: Normal rate, regular rhythm, normal heart sounds and intact distal pulses.  No murmur heard. Pulmonary/Chest: Effort normal and breath  sounds normal. No respiratory distress. He has no wheezes.  Abdominal: Soft. Bowel sounds are normal. He exhibits no distension. There is no tenderness.  Musculoskeletal: Normal range of motion. He exhibits no edema or tenderness.  Neurological: He is alert and oriented to person, place, and time. He has normal reflexes. No cranial nerve deficit.  Skin: Skin is warm. No rash noted. He is diaphoretic. No erythema.  Psychiatric: He has a normal mood and affect. His behavior is normal. Judgment and thought content normal.  Vitals reviewed.     BP 107/73   Pulse 95   Temp 99.4 F (37.4 C) (Oral)   Ht 6' 3"  (1.905 m)   Wt 246 lb 4 oz (111.7 kg)   BMI 30.78 kg/m      Assessment & Plan:  ZADE FALKNER comes in today with chief complaint of Chills (pt here today c/o fever, chills, aches and feels terrible)   Diagnosis and orders addressed:  1. Fever, unspecified fever cause - Veritor Flu A/B Waived - Rapid Strep Screen ([facility] ONLY) - BMP8+EGFR  2. Tick bite, initial encounter -Pt to report any new fever, joint pain, or rash -Wear protective clothing while outside- Long sleeves and long pants -Put insect repellent on all exposed skin and along clothing -Take a shower as soon as possible after being outside - BMP8+EGFR - Lyme Ab/Western Blot Reflex - Rocky mtn spotted fvr abs pnl(IgG+IgM)  3. Viral upper respiratory tract infection - Take meds as prescribed - Use a cool mist humidifier  -Use saline nose sprays frequently -Force fluids -For  any cough or congestion  Use plain Mucinex- regular strength or max strength is fine -For fever or aces or pains- take tylenol or ibuprofen. -Throat lozenges if help -New toothbrush in 3 days RTO if symptoms worsen or do not improve  - BMP8+EGFR - fluticasone (FLONASE) 50 MCG/ACT nasal spray; Place 2 sprays into both nostrils daily.  Dispense: 16 g; Refill: [place], FNP

## 2018-08-07 LAB — LYME AB/WESTERN BLOT REFLEX

## 2018-08-07 LAB — BMP8+EGFR
BUN/Creatinine Ratio: 10 (ref 9–20)
BUN: 11 mg/dL (ref 6–24)
CHLORIDE: 103 mmol/L (ref 96–106)
CO2: 21 mmol/L (ref 20–29)
Calcium: 9.6 mg/dL (ref 8.7–10.2)
Creatinine, Ser: 1.11 mg/dL (ref 0.76–1.27)
GFR calc Af Amer: 92 mL/min/{1.73_m2} (ref >59)
GFR, EST NON AFRICAN AMERICAN: 80 mL/min/{1.73_m2} (ref >59)
GLUCOSE: 120 mg/dL — AB (ref 65–99)
POTASSIUM: 3.9 mmol/L (ref 3.5–5.2)
Sodium: 139 mmol/L (ref 134–144)

## 2018-08-07 LAB — ROCKY MTN SPOTTED FVR ABS PNL(IGG+IGM)
RMSF IgG: NEGATIVE
RMSF IgM: 0.37 index (ref 0.00–0.89)

## 2018-09-02 ENCOUNTER — Ambulatory Visit: Payer: 59 | Admitting: Cardiology

## 2018-09-03 MED FILL — SHIPPING COST: 1 days supply | Qty: 1 | Fill #2

## 2018-09-03 MED FILL — METOPROLOL SUCCINATE ER 50: 50 | 90 days supply | Qty: 90 | Fill #1

## 2018-09-21 ENCOUNTER — Ambulatory Visit (INDEPENDENT_AMBULATORY_CARE_PROVIDER_SITE_OTHER): Payer: 59 | Admitting: Family Medicine

## 2018-09-21 ENCOUNTER — Encounter: Payer: Self-pay | Admitting: Family Medicine

## 2018-09-21 VITALS — BP 126/89 | HR 65 | Temp 97.9°F | Ht 75.0 in | Wt 249.0 lb

## 2018-09-21 DIAGNOSIS — E039 Hypothyroidism, unspecified: Secondary | ICD-10-CM | POA: Diagnosis not present

## 2018-09-21 DIAGNOSIS — N3943 Post-void dribbling: Secondary | ICD-10-CM | POA: Diagnosis not present

## 2018-09-21 DIAGNOSIS — S76311A Strain of muscle, fascia and tendon of the posterior muscle group at thigh level, right thigh, initial encounter: Secondary | ICD-10-CM

## 2018-09-21 DIAGNOSIS — R1031 Right lower quadrant pain: Secondary | ICD-10-CM | POA: Diagnosis not present

## 2018-09-21 DIAGNOSIS — R7989 Other specified abnormal findings of blood chemistry: Secondary | ICD-10-CM

## 2018-09-21 MED ORDER — KETOROLAC TROMETHAMINE 30 MG/ML IJ SOLN
30.0000 mg | Freq: Once | INTRAMUSCULAR | Status: AC
  Administered 2018-09-21: 30 mg via INTRAMUSCULAR

## 2018-09-21 MED ORDER — DICLOFENAC SODIUM 75 MG PO TBEC: 75.0000 mg | DELAYED_RELEASE_TABLET | Freq: Two times a day (BID) | ORAL | 0 refills | Status: DC | PRN

## 2018-09-21 NOTE — Addendum Note (Signed)
Addended by: Liliane Bade on: 09/21/2018 05:18 PM   Modules accepted: Orders
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
## 2018-09-21 NOTE — Patient Instructions (Signed)
Your symptoms are very suggestive of piriformis syndrome, which is a muscle strain.  This can cause pressure on the nerve called the pudendal nerve which radiates into the groin.  I think that this is why you are having pain that starts in the buttock and radiates to the groin.  Nothing on your exam is suggestive of a urinary or prostate etiology.  But because you are having some dribbling after urination we are we will recheck your PSA level today.  We will contact you with the results of your labs once available, this should be within the next couple of days.  In the interim, you are given a dose of an anti-inflammatory/pain medication via injection today.  I have prescribed you an oral medication that you can take twice a day if needed for pain or inflammation.  You have also been given at home physical therapy exercises to perform.  If symptoms worsen or you develop any other worrisome symptoms or signs that we discussed, please seek reevaluation.

## 2018-09-22 ENCOUNTER — Other Ambulatory Visit: Payer: Self-pay | Admitting: Family Medicine

## 2018-09-22 ENCOUNTER — Other Ambulatory Visit: Payer: 59

## 2018-09-22 DIAGNOSIS — R8290 Unspecified abnormal findings in urine: Secondary | ICD-10-CM

## 2018-09-22 DIAGNOSIS — R1031 Right lower quadrant pain: Secondary | ICD-10-CM

## 2018-09-22 LAB — CBC
Hematocrit: 45.9 % (ref 37.5–51.0)
Hemoglobin: 15.7 g/dL (ref 13.0–17.7)
MCH: 30.5 pg (ref 26.6–33.0)
MCHC: 34.2 g/dL (ref 31.5–35.7)
MCV: 89 fL (ref 79–97)
Platelets: 363 10*3/uL (ref 150–450)
RBC: 5.14 x10E6/uL (ref 4.14–5.80)
RDW: 13.5 % (ref 12.3–15.4)
WBC: 13.6 10*3/uL — AB (ref 3.4–10.8)

## 2018-09-22 LAB — THYROID PANEL WITH TSH
FREE THYROXINE INDEX: 2.6 (ref 1.2–4.9)
T3 Uptake Ratio: 28 % (ref 24–39)
T4, Total: 9.2 ug/dL (ref 4.5–12.0)
TSH: 2.3 u[IU]/mL (ref 0.450–4.500)

## 2018-09-22 LAB — BASIC METABOLIC PANEL
BUN/Creatinine Ratio: 12 (ref 9–20)
BUN: 13 mg/dL (ref 6–24)
CALCIUM: 9.6 mg/dL (ref 8.7–10.2)
CHLORIDE: 104 mmol/L (ref 96–106)
CO2: 22 mmol/L (ref 20–29)
Creatinine, Ser: 1.09 mg/dL (ref 0.76–1.27)
GFR calc Af Amer: 94 mL/min/{1.73_m2} (ref >59)
GFR, EST NON AFRICAN AMERICAN: 82 mL/min/{1.73_m2} (ref >59)
GLUCOSE: 91 mg/dL (ref 65–99)
POTASSIUM: 3.8 mmol/L (ref 3.5–5.2)
SODIUM: 144 mmol/L (ref 134–144)

## 2018-09-22 LAB — MICROSCOPIC EXAMINATION
Bacteria, UA: NONE SEEN
EPITHELIAL CELLS (NON RENAL): NONE SEEN /HPF (ref 0–10)
RBC MICROSCOPIC, UA: NONE SEEN /HPF (ref 0–2)
RENAL EPITHEL UA: NONE SEEN /HPF
WBC, UA: NONE SEEN /hpf (ref 0–5)

## 2018-09-22 LAB — URINALYSIS, COMPLETE
BILIRUBIN UA: NEGATIVE
Glucose, UA: NEGATIVE
LEUKOCYTES UA: NEGATIVE
NITRITE UA: NEGATIVE
Protein, UA: NEGATIVE
RBC UA: NEGATIVE
Urobilinogen, Ur: 2 mg/dL — ABNORMAL HIGH (ref 0.2–1.0)
pH, UA: 6 (ref 5.0–7.5)

## 2018-09-22 LAB — PSA: PROSTATE SPECIFIC AG, SERUM: 1.7 ng/mL (ref 0.0–4.0)

## 2018-09-22 MED ORDER — SULFAMETHOXAZOLE-TRIMETHOPRIM 800-160 MG PO TABS: 1.0000 | ORAL_TABLET | Freq: Two times a day (BID) | ORAL | 0 refills | Status: AC

## 2018-09-24 ENCOUNTER — Ambulatory Visit: Payer: 59 | Admitting: Cardiovascular Disease

## 2018-09-24 LAB — URINE CULTURE

## 2018-09-27 ENCOUNTER — Ambulatory Visit: Payer: 59 | Admitting: Cardiology
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
## 2018-10-04 ENCOUNTER — Encounter: Payer: Self-pay | Admitting: Family Medicine

## 2018-10-04 ENCOUNTER — Ambulatory Visit (INDEPENDENT_AMBULATORY_CARE_PROVIDER_SITE_OTHER): Payer: 59 | Admitting: Family Medicine

## 2018-10-04 ENCOUNTER — Ambulatory Visit (INDEPENDENT_AMBULATORY_CARE_PROVIDER_SITE_OTHER): Payer: 59

## 2018-10-04 VITALS — BP 130/89 | HR 73 | Temp 97.4°F | Ht 75.0 in | Wt 245.0 lb

## 2018-10-04 DIAGNOSIS — J019 Acute sinusitis, unspecified: Secondary | ICD-10-CM | POA: Diagnosis not present

## 2018-10-04 DIAGNOSIS — R0602 Shortness of breath: Secondary | ICD-10-CM | POA: Diagnosis not present

## 2018-10-04 DIAGNOSIS — R05 Cough: Secondary | ICD-10-CM | POA: Diagnosis not present

## 2018-10-04 DIAGNOSIS — B9689 Other specified bacterial agents as the cause of diseases classified elsewhere: Secondary | ICD-10-CM

## 2018-10-04 DIAGNOSIS — R059 Cough, unspecified: Secondary | ICD-10-CM

## 2018-10-04 MED ORDER — AZITHROMYCIN 250 MG PO TABS: ORAL_TABLET | ORAL | 0 refills | Status: DC

## 2018-10-04 MED ORDER — PANTOPRAZOLE SODIUM 40 MG PO TBEC: 40.0000 mg | DELAYED_RELEASE_TABLET | Freq: Every day | ORAL | 1 refills | Status: DC

## 2018-10-04 MED ORDER — LEVOTHYROXINE SODIUM 100 MCG PO TABS: 100.0000 ug | ORAL_TABLET | Freq: Every day | ORAL | 3 refills | Status: DC

## 2018-10-04 MED ORDER — BENZONATATE 100 MG PO CAPS: 100.0000 mg | ORAL_CAPSULE | Freq: Three times a day (TID) | ORAL | 0 refills | Status: DC | PRN

## 2018-10-04 MED FILL — SHIPPING COST: 1 days supply | Qty: 1 | Fill #3

## 2018-10-04 MED FILL — PANTOPRAZOLE SOD DR 40 MG T: 40 | 90 days supply | Qty: 90 | Fill #1

## 2018-10-04 NOTE — Patient Instructions (Signed)
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
## 2018-10-11 MED FILL — LEVOTHYROXINE 100 MCG TAB: 100 | 90 days supply | Qty: 90 | Fill #0

## 2018-10-11 MED FILL — SHIPPING COST: 1 days supply | Qty: 1 | Fill #4

## 2018-10-12 ENCOUNTER — Telehealth: Payer: Self-pay | Admitting: Family Medicine

## 2018-10-12 NOTE — Telephone Encounter (Signed)
Wife aware per dpr.  

## 2018-10-12 NOTE — Telephone Encounter (Signed)
I'd like him to be seen.  Needs to check electrolytes if getting weak and diarrhea has not stopped.

## 2018-10-12 NOTE — Telephone Encounter (Signed)
Need advice please - Steve Taylor still has slight cough, fatigue, sweating and then Saturday night started with diarrhea that still hasn't stop and is very weak, dehydrated and chills.  I am just concerned and don't know if this is just a virus or what. Took last antibiotic on Friday.

## 2018-11-23 ENCOUNTER — Ambulatory Visit: Payer: No Typology Code available for payment source | Admitting: Cardiology

## 2018-11-23 ENCOUNTER — Encounter: Payer: Self-pay | Admitting: Cardiology

## 2018-11-23 VITALS — BP 130/80 | HR 67 | Ht 75.0 in | Wt 246.0 lb

## 2018-11-23 DIAGNOSIS — R002 Palpitations: Secondary | ICD-10-CM
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
## 2018-11-23 NOTE — Patient Instructions (Signed)

## 2018-12-03 MED FILL — SHIPPING COST: 1 days supply | Qty: 1 | Fill #5

## 2018-12-03 MED FILL — METOPROLOL SUCCINATE ER 50: 50 | 90 days supply | Qty: 90 | Fill #2

## 2019-01-05 MED FILL — SHIPPING COST: 1 days supply | Qty: 1 | Fill #6

## 2019-01-05 MED FILL — LEVOTHYROXINE 100 MCG TAB: 100 | 90 days supply | Qty: 90 | Fill #1

## 2019-03-01 MED FILL — METOPROLOL SUCC ER 50 MG TA: 50 | 90 days supply | Qty: 90 | Fill #3

## 2019-03-31 MED FILL — PANTOPRAZOLE SOD DR 40 MG T: 40 | 90 days supply | Qty: 90 | Fill #0

## 2019-04-19 MED FILL — LEVOTHYROXINE 100 MCG TAB: 100 | 90 days supply | Qty: 90 | Fill #2

## 2019-05-31 ENCOUNTER — Telehealth: Payer: Self-pay | Admitting: Family Medicine

## 2019-05-31 MED ORDER — METOPROLOL SUCCINATE ER 50 MG PO TB24: 50.0000 mg | ORAL_TABLET | Freq: Every day | ORAL | 3 refills | Status: DC

## 2019-05-31 MED FILL — METOPROLOL SUCCINATE ER 50: 50 | 90 days supply | Qty: 90 | Fill #0

## 2019-05-31 NOTE — Telephone Encounter (Signed)
Pts wife states pt needs rf metoprolol succinate 50 mg called to [facility].

## 2019-05-31 NOTE — Telephone Encounter (Signed)
Refill sent. Wife aware

## 2019-06-27 MED FILL — PANTOPRAZOLE SOD DR 40 MG T: 40 | 90 days supply | Qty: 90 | Fill #1

## 2019-07-25 MED FILL — LEVOTHYROXINE 100 MCG TAB: 100 | 90 days supply | Qty: 90 | Fill #3

## 2019-08-29 MED FILL — METOPROLOL SUCCINATE ER 50: 50 | 90 days supply | Qty: 90 | Fill #1

## 2019-10-31 ENCOUNTER — Ambulatory Visit: Payer: 59 | Admitting: Family Medicine

## 2019-10-31 ENCOUNTER — Ambulatory Visit (INDEPENDENT_AMBULATORY_CARE_PROVIDER_SITE_OTHER): Payer: No Typology Code available for payment source | Admitting: Family Medicine

## 2019-10-31 ENCOUNTER — Other Ambulatory Visit: Payer: Self-pay

## 2019-10-31 DIAGNOSIS — K219 Gastro-esophageal reflux disease without esophagitis: Secondary | ICD-10-CM | POA: Diagnosis not present

## 2019-10-31 DIAGNOSIS — E039 Hypothyroidism, unspecified: Secondary | ICD-10-CM | POA: Diagnosis not present

## 2019-10-31 DIAGNOSIS — R002 Palpitations: Secondary | ICD-10-CM | POA: Diagnosis not present

## 2019-10-31 MED ORDER — METOPROLOL SUCCINATE ER 50 MG PO TB24: 50.0000 mg | ORAL_TABLET | Freq: Every day | ORAL | 3 refills | Status: DC

## 2019-10-31 MED ORDER — PANTOPRAZOLE SODIUM 40 MG PO TBEC: 40.0000 mg | DELAYED_RELEASE_TABLET | Freq: Every day | ORAL | 3 refills | Status: DC

## 2019-10-31 MED ORDER — LEVOTHYROXINE SODIUM 100 MCG PO TABS: 100.0000 ug | ORAL_TABLET | Freq: Every day | ORAL | 0 refills | Status: DC

## 2019-10-31 MED FILL — PANTOPRAZOLE SOD DR 40 MG T: 40 | 90 days supply | Qty: 90 | Fill #0

## 2019-10-31 MED FILL — LEVOTHYROXINE SODIUM 100 MC: 100 | 90 days supply | Qty: 90 | Fill #0
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
## 2019-11-02 ENCOUNTER — Other Ambulatory Visit: Payer: Self-pay

## 2019-11-02 ENCOUNTER — Other Ambulatory Visit: Payer: No Typology Code available for payment source

## 2019-11-02 DIAGNOSIS — R002 Palpitations: Secondary | ICD-10-CM

## 2019-11-02 DIAGNOSIS — E039 Hypothyroidism, unspecified: Secondary | ICD-10-CM

## 2019-11-03 LAB — THYROID PANEL WITH TSH
Free Thyroxine Index: 1.6 (ref 1.2–4.9)
T3 Uptake Ratio: 24 % (ref 24–39)
T4, Total: 6.8 ug/dL (ref 4.5–12.0)
TSH: 2.07 u[IU]/mL (ref 0.450–4.500)

## 2019-11-03 LAB — CMP14+EGFR
ALT: 44 IU/L (ref 0–44)
AST: 34 IU/L (ref 0–40)
Albumin/Globulin Ratio: 1.5 (ref 1.2–2.2)
Albumin: 4 g/dL (ref 4.0–5.0)
Alkaline Phosphatase: 83 IU/L (ref 39–117)
BUN/Creatinine Ratio: 12 (ref 9–20)
BUN: 13 mg/dL (ref 6–24)
Bilirubin Total: 0.5 mg/dL (ref 0.0–1.2)
CO2: 24 mmol/L (ref 20–29)
Calcium: 9.5 mg/dL (ref 8.7–10.2)
Chloride: 104 mmol/L (ref 96–106)
Creatinine, Ser: 1.11 mg/dL (ref 0.76–1.27)
GFR calc Af Amer: 92 mL/min/{1.73_m2} (ref >59)
GFR calc non Af Amer: 79 mL/min/{1.73_m2} (ref >59)
Globulin, Total: 2.6 g/dL (ref 1.5–4.5)
Glucose: 126 mg/dL — ABNORMAL HIGH (ref 65–99)
Potassium: 4.2 mmol/L (ref 3.5–5.2)
Sodium: 140 mmol/L (ref 134–144)
Total Protein: 6.6 g/dL (ref 6.0–8.5)

## 2019-11-03 LAB — CBC
Hematocrit: 47.1 % (ref 37.5–51.0)
Hemoglobin: 16.4 g/dL (ref 13.0–17.7)
MCH: 30.5 pg (ref 26.6–33.0)
MCHC: 34.8 g/dL (ref 31.5–35.7)
MCV: 88 fL (ref 79–97)
Platelets: 382 10*3/uL (ref 150–450)
RBC: 5.37 x10E6/uL (ref 4.14–5.80)
RDW: 13.6 % (ref 11.6–15.4)
WBC: 9.6 10*3/uL (ref 3.4–10.8)

## 2019-11-03 LAB — LIPID PANEL
Chol/HDL Ratio: 5.8 ratio — ABNORMAL HIGH (ref 0.0–5.0)
Cholesterol, Total: 219 mg/dL — ABNORMAL HIGH (ref 100–199)
HDL: 38 mg/dL — ABNORMAL LOW (ref >39)
LDL Chol Calc (NIH): 143 mg/dL — ABNORMAL HIGH (ref 0–99)
Triglycerides: 208 mg/dL — ABNORMAL HIGH (ref 0–149)
VLDL Cholesterol Cal: 38 mg/dL (ref 5–40)

## 2019-12-02 MED FILL — METOPROLOL SUCCINATE ER 50: 50 | 90 days supply | Qty: 90 | Fill #2

## 2020-01-27 MED FILL — PANTOPRAZOLE SOD DR 40 MG T: 40 | 90 days supply | Qty: 90 | Fill #1

## 2020-01-28 ENCOUNTER — Other Ambulatory Visit: Payer: Self-pay | Admitting: Family Medicine

## 2020-01-28 DIAGNOSIS — E039 Hypothyroidism, unspecified: Secondary | ICD-10-CM

## 2020-01-30 MED FILL — LEVOTHYROXINE SODIUM 100 MC: 100 | 90 days supply | Qty: 90 | Fill #0

## 2020-02-06 ENCOUNTER — Ambulatory Visit: Payer: No Typology Code available for payment source | Admitting: Physician Assistant

## 2020-02-06 ENCOUNTER — Encounter: Payer: Self-pay | Admitting: Physician Assistant

## 2020-02-06 ENCOUNTER — Other Ambulatory Visit: Payer: Self-pay

## 2020-02-06 DIAGNOSIS — L918 Other hypertrophic disorders of the skin: Secondary | ICD-10-CM | POA: Diagnosis not present

## 2020-02-06 DIAGNOSIS — D485 Neoplasm of uncertain behavior of skin: Secondary | ICD-10-CM

## 2020-02-06 DIAGNOSIS — Z1283 Encounter for screening for malignant neoplasm of skin: Secondary | ICD-10-CM | POA: Diagnosis not present

## 2020-02-06 DIAGNOSIS — D492 Neoplasm of unspecified behavior of bone, soft tissue, and skin: Secondary | ICD-10-CM

## 2020-02-06 NOTE — Patient Instructions (Signed)

## 2020-02-06 NOTE — Progress Notes (Signed)
New Patient   Subjective  Steve Taylor is a 49 y.o. male who presents for the following: Annual Exam (No concerns, patients wife wanted him to come get checked out).No H/o MM or nmsc but has had some moles removed in the past.  The following portions of the chart were reviewed this encounter and updated as appropriate: Tobacco  Allergies  Meds  Problems  Med Hx  Surg Hx  Fam Hx      Objective  Well appearing patient in no apparent distress; mood and affect are within normal limits.  A full examination was performed including scalp, head, eyes, ears, nose, lips, neck, chest, axillae, abdomen, back, buttocks, bilateral upper extremities, bilateral lower extremities, hands, feet, fingers, toes, fingernails, and toenails. All findings within normal limits unless otherwise noted below.  Objective  Left Lower Back: Black macule       Objective  Right Breast: Black macule       Objective  Left Breast: CN with dark edge       Objective  Right Ala Nasi: Pearly plaque       Objective  Left Medial Thigh: Fleshy, skin-colored sessile and pedunculated papules.     Assessment & Plan  Neoplasm of skin (4) Left Lower Back  Skin / nail biopsy Type of biopsy: tangential   Informed consent: discussed and consent obtained   Timeout: patient name, date of birth, surgical site, and procedure verified   Anesthesia: the lesion was anesthetized in a standard fashion   Anesthetic:  1% lidocaine w/ epinephrine 1-100,000 local infiltration Instrument used: flexible razor blade   Hemostasis achieved with: aluminum chloride and electrodesiccation   Outcome: patient tolerated procedure well   Post-procedure details: wound care instructions given    Specimen 1 - Surgical pathology Differential Diagnosis: DN Check Margins: No  Right Breast  Skin / nail biopsy Type of biopsy: tangential   Timeout: patient name, date of birth, surgical site, and procedure verified    Procedure prep:  Patient was prepped and draped in usual sterile fashion Prep type:  Chlorhexidine Anesthesia: the lesion was anesthetized in a standard fashion   Anesthetic:  1% lidocaine w/ epinephrine 1-100,000 local infiltration Instrument used: DermaBlade   Hemostasis achieved with: aluminum chloride   Outcome: patient tolerated procedure well   Post-procedure details: wound care instructions given    Specimen 2 - Surgical pathology Differential Diagnosis:  Check Margins: No  Left Breast  Skin / nail biopsy Type of biopsy: tangential   Informed consent: discussed and consent obtained   Timeout: patient name, date of birth, surgical site, and procedure verified   Procedure prep:  Patient was prepped and draped in usual sterile fashion Prep type:  Chlorhexidine Instrument used: DermaBlade   Hemostasis achieved with: suture and aluminum chloride   Outcome: patient tolerated procedure well   Post-procedure details: sterile dressing applied and wound care instructions given    Specimen 3 - Surgical pathology Differential Diagnosis:  Check Margins: No  Right Ala Nasi  Skin / nail biopsy Type of biopsy: tangential   Informed consent: discussed and consent obtained   Timeout: patient name, date of birth, surgical site, and procedure verified   Anesthesia: the lesion was anesthetized in a standard fashion   Anesthetic:  1% lidocaine w/ epinephrine 1-100,000 local infiltration Instrument used: flexible razor blade   Hemostasis achieved with: aluminum chloride and electrodesiccation   Outcome: patient tolerated procedure well   Post-procedure details: wound care instructions given  Specimen 4 - Surgical pathology Differential Diagnosis:  Check Margins: No  Screening exam for skin cancer Chest - Medial [facility])  Skin tag Left Medial Thigh  Snip with scissors

## 2020-02-07 ENCOUNTER — Telehealth: Payer: Self-pay

## 2020-02-07 NOTE — Telephone Encounter (Signed)
Phone call from [facility] to verify the differential diagnosis on 3/4 of the biopsy sites from yesterday.  Spoke with Vida Roller to confirm that Site 2 and 3 were atypia and Site 4 was SCC.

## 2020-02-13 ENCOUNTER — Telehealth: Payer: Self-pay | Admitting: *Deleted

## 2020-02-13 DIAGNOSIS — C44311 Basal cell carcinoma of skin of nose: Secondary | ICD-10-CM

## 2020-02-13 NOTE — Telephone Encounter (Signed)
Phone call to patient for path report. Left message to call us back.

## 2020-02-13 NOTE — Telephone Encounter (Signed)
-----   Message from Warren Danes, [place] sent at 02/08/2020 12:15 PM EDT ----- Diagnosis 1. 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 Skin , left breast DYSPLASTIC COMPOUND NEVUS WITH MILD ATYPIA WITH SCAR, PERIPHERAL AND DEEP MARGINS INVOLVED, SEE DESCRIPTION  1-3 recheck 6 mo   4. Skin , right ala nasi BASAL CELL CARCINOMA, NODULAR PATTERN  MOHs

## 2020-02-13 NOTE — Telephone Encounter (Signed)
Pathology report to patient. Will send over path and notes to the skin surgery center. 6 month follow up made.

## 2020-03-02 MED FILL — METOPROLOL SUCCINATE ER 50: 50 | 90 days supply | Qty: 90 | Fill #3

## 2020-03-07 ENCOUNTER — Ambulatory Visit (HOSPITAL_COMMUNITY)
Admission: RE | Admit: 2020-03-07 | Discharge: 2020-03-07 | Disposition: A | Discharge status: Home / Self Care | Payer: No Typology Code available for payment source | Source: Ambulatory Visit | Attending: Family Medicine | Admitting: Family Medicine

## 2020-03-07 ENCOUNTER — Encounter: Payer: Self-pay | Admitting: Family Medicine

## 2020-03-07 ENCOUNTER — Other Ambulatory Visit: Payer: Self-pay

## 2020-03-07 ENCOUNTER — Ambulatory Visit (INDEPENDENT_AMBULATORY_CARE_PROVIDER_SITE_OTHER): Payer: No Typology Code available for payment source | Admitting: Family Medicine

## 2020-03-07 VITALS — BP 135/87 | HR 60 | Temp 98.1°F | Ht 75.0 in | Wt 256.6 lb

## 2020-03-07 DIAGNOSIS — M79661 Pain in right lower leg: Secondary | ICD-10-CM | POA: Insufficient documentation

## 2020-03-07 MED ORDER — PREDNISONE 10 MG PO TABS: ORAL_TABLET | ORAL | 0 refills | Status: DC
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
## 2020-03-07 NOTE — Addendum Note (Signed)
Addended by: Claretta Fraise on: 03/07/2020 07:20 PM   Modules accepted: Orders

## 2020-03-07 NOTE — Addendum Note (Signed)
Addended by: Claretta Fraise on: 03/07/2020 02:24 PM   Modules accepted: Orders

## 2020-03-07 NOTE — Addendum Note (Signed)
Addended by: Wardell Heath on: 03/07/2020 02:43 PM   Modules accepted: Orders

## 2020-03-07 NOTE — Addendum Note (Signed)
Addended by: Wardell Heath on: 03/07/2020 02:45 PM   Modules accepted: Orders

## 2020-05-28 MED FILL — METOPROLOL SUCCINATE ER 50: 50 | 90 days supply | Qty: 90 | Fill #0

## 2020-06-19 ENCOUNTER — Telehealth: Payer: Self-pay | Admitting: Family Medicine

## 2020-06-19 NOTE — Telephone Encounter (Signed)
Information requested given to wife Aucilla.

## 2020-06-19 NOTE — Telephone Encounter (Signed)
Pts wife would like last reading of cholesterol, glucose and bp. Please call at [phone]

## 2020-07-19 ENCOUNTER — Ambulatory Visit: Payer: No Typology Code available for payment source | Attending: Internal Medicine

## 2020-07-19 DIAGNOSIS — Z23 Encounter for immunization: Secondary | ICD-10-CM

## 2020-07-19 NOTE — Progress Notes (Signed)
   Covid-19 Vaccination Clinic  Name:  Steve Taylor    MRN: [id number] DOB: 1971-03-30  07/19/2020  [name] was observed post Covid-19 immunization for 15 minutes without incident. He was provided with Vaccine Information Sheet and instruction to access the V-Safe system.   [name] was instructed to call 911 with any severe reactions post vaccine: Marland Kitchen Difficulty breathing  . Swelling of face and throat  . A fast heartbeat  . A bad rash all over body  . Dizziness and weakness   Immunizations Administered    Name Date Dose VIS Date Route   Pfizer COVID-19 Vaccine 07/19/2020  2:22 PM 0.3 mL 01/11/2019 Intramuscular   Manufacturer: [employer]   Lot: [id number]   [place]: [id number]

## 2020-07-26 MED FILL — PANTOPRAZOLE SOD DR 40 MG T: 40 | 90 days supply | Qty: 90 | Fill #3

## 2020-07-26 MED FILL — LEVOTHYROXINE SODIUM 100 MC: 100 | 90 days supply | Qty: 90 | Fill #2

## 2020-08-09 ENCOUNTER — Ambulatory Visit: Payer: No Typology Code available for payment source

## 2020-08-23 ENCOUNTER — Ambulatory Visit (INDEPENDENT_AMBULATORY_CARE_PROVIDER_SITE_OTHER): Payer: No Typology Code available for payment source

## 2020-08-23 ENCOUNTER — Other Ambulatory Visit: Payer: Self-pay

## 2020-08-23 ENCOUNTER — Ambulatory Visit (INDEPENDENT_AMBULATORY_CARE_PROVIDER_SITE_OTHER): Payer: No Typology Code available for payment source | Admitting: Nurse Practitioner

## 2020-08-23 ENCOUNTER — Encounter: Payer: Self-pay | Admitting: Nurse Practitioner

## 2020-08-23 VITALS — BP 125/91 | HR 65 | Temp 97.5°F | Resp 20 | Ht 75.0 in | Wt 249.0 lb

## 2020-08-23 DIAGNOSIS — R1030 Lower abdominal pain, unspecified: Secondary | ICD-10-CM | POA: Diagnosis not present

## 2020-08-23 DIAGNOSIS — E039 Hypothyroidism, unspecified: Secondary | ICD-10-CM | POA: Diagnosis not present

## 2020-08-23 MED ORDER — METRONIDAZOLE 500 MG PO TABS: 500.0000 mg | ORAL_TABLET | Freq: Two times a day (BID) | ORAL | 0 refills | Status: DC

## 2020-08-23 MED ORDER — CIPROFLOXACIN HCL 500 MG PO TABS: 500.0000 mg | ORAL_TABLET | Freq: Two times a day (BID) | ORAL | 0 refills | Status: DC

## 2020-08-23 NOTE — Patient Instructions (Signed)
Diverticulitis  Diverticulitis is infection or inflammation of small pouches (diverticula) in the colon that form due to a condition called diverticulosis. Diverticula can trap stool (feces) and bacteria, causing infection and inflammation. Diverticulitis may cause severe stomach pain and diarrhea. It may lead to tissue damage in the colon that causes bleeding. The diverticula may also burst (rupture) and cause infected stool to enter other areas of the abdomen. Complications of diverticulitis can include:  Bleeding.  Severe infection.  Severe pain.  Rupture (perforation) of the colon.  Blockage (obstruction) of the colon. What are the causes? This condition is caused by stool becoming trapped in the diverticula, which allows bacteria to grow in the diverticula. This leads to inflammation and infection. What increases the risk? You are more likely to develop this condition if:  You have diverticulosis. The risk for diverticulosis increases if: ? You are overweight or obese. ? You use tobacco products. ? You do not get enough exercise.  You eat a diet that does not include enough fiber. High-fiber foods include fruits, vegetables, beans, nuts, and whole grains. What are the signs or symptoms? Symptoms of this condition may include:  Pain and tenderness in the abdomen. The pain is normally located on the left side of the abdomen, but it may occur in other areas.  Fever and chills.  Bloating.  Cramping.  Nausea.  Vomiting.  Changes in bowel routines.  Blood in your stool. How is this diagnosed? This condition is diagnosed based on:  Your medical history.  A physical exam.  Tests to make sure there is nothing else causing your condition. These tests may include: ? Blood tests. ? Urine tests. ? Imaging tests of the abdomen, including X-rays, ultrasounds, MRIs, or CT scans. How is this treated? Most cases of this condition are mild and can be treated at home.  Treatment may include:  Taking over-the-counter pain medicines.  Following a clear liquid diet.  Taking antibiotic medicines by mouth.  Rest. More severe cases may need to be treated at a hospital. Treatment may include:  Not eating or drinking.  Taking prescription pain medicine.  Receiving antibiotic medicines through an IV tube.  Receiving fluids and nutrition through an IV tube.  Surgery. When your condition is under control, your health care provider may recommend that you have a colonoscopy. This is an exam to look at the entire large intestine. During the exam, a lubricated, bendable tube is inserted into the anus and then passed into the rectum, colon, and other parts of the large intestine. A colonoscopy can show how severe your diverticula are and whether something else may be causing your symptoms. Follow these instructions at home: Medicines  Take over-the-counter and prescription medicines only as told by your health care provider. These include fiber supplements, probiotics, and stool softeners.  If you were prescribed an antibiotic medicine, take it as told by your health care provider. Do not stop taking the antibiotic even if you start to feel better.  Do not drive or use heavy machinery while taking prescription pain medicine. General instructions   Follow a full liquid diet or another diet as directed by your health care provider. After your symptoms improve, your health care provider may tell you to change your diet. He or she may recommend that you eat a diet that contains at least 25 g (25 grams) of fiber daily. Fiber makes it easier to pass stool. Healthy sources of fiber include: ? Berries. One cup contains 4-8 grams of   fiber. ? Beans or lentils. One half cup contains 5-8 grams of fiber. ? Green vegetables. One cup contains 4 grams of fiber.  Exercise for at least 30 minutes, 3 times each week. You should exercise hard enough to raise your heart rate and  break a sweat.  Keep all follow-up visits as told by your health care provider. This is important. You may need a colonoscopy. Contact a health care provider if:  Your pain does not improve.  You have a hard time drinking or eating food.  Your bowel movements do not return to normal. Get help right away if:  Your pain gets worse.  Your symptoms do not get better with treatment.  Your symptoms suddenly get worse.  You have a fever.  You vomit more than one time.  You have stools that are bloody, black, or tarry. Summary  Diverticulitis is infection or inflammation of small pouches (diverticula) in the colon that form due to a condition called diverticulosis. Diverticula can trap stool (feces) and bacteria, causing infection and inflammation.  You are at higher risk for this condition if you have diverticulosis and you eat a diet that does not include enough fiber.  Most cases of this condition are mild and can be treated at home. More severe cases may need to be treated at a hospital.  When your condition is under control, your health care provider may recommend that you have an exam called a colonoscopy. This exam can show how severe your diverticula are and whether something else may be causing your symptoms. This information is not intended to replace advice given to you by your health care provider. Make sure you discuss any questions you have with your health care provider. Document Revised: 10/16/2017 Document Reviewed: 12/06/2016 Elsevier Patient Education  2020 Elsevier Inc.  

## 2020-08-23 NOTE — Addendum Note (Signed)
Addended by: Rolena Infante on: 08/23/2020 02:14 PM   Modules accepted: Orders

## 2020-08-23 NOTE — Progress Notes (Signed)
   Subjective:    Patient ID: Steve Taylor, male    DOB: Jul 12, 1971, 49 y.o.   MRN: [id number]   Chief Complaint: Abdominal Pain   HPI Pt here for abdominal pain. Says it started Saturday but he didn't really pay attention to it until Sunday. When the pain hits it is a sharp, stabbing feeling and like he needs to urgently have a bowel movement. The pain will hit at various times throughout the day. It is not necessarily connected with eating though the pain will occur shortly after eating. He goes to the bathroom and initially he was getting a small amount of hard stools but it is now more like diarrhea. When he does go to the bathroom he may not have a bowel movement at all or he may have a very small amount. The pain only lasts for a few minutes and he has been having several episodes per day.   Review of Systems  Constitutional: Negative.   HENT: Negative.   Eyes: Negative.   Respiratory: Negative.   Cardiovascular: Negative.   Gastrointestinal: Positive for abdominal pain and diarrhea.  Skin: Negative.   Neurological: Negative.   Psychiatric/Behavioral: Negative.        Objective:   Physical Exam Vitals and nursing note reviewed.  Constitutional:      Appearance: He is well-developed. He is obese.  Cardiovascular:     Heart sounds: Normal heart sounds.  Pulmonary:     Effort: Pulmonary effort is normal.     Breath sounds: Normal breath sounds.  Abdominal:     General: Bowel sounds are normal.     Palpations: Abdomen is rigid.     Tenderness: There is abdominal tenderness in the suprapubic area. There is no guarding or rebound. Negative signs include Murphy's sign, Rovsing's sign and McBurney's sign.  Skin:    General: Skin is warm.  Neurological:     General: No focal deficit present.     Mental Status: He is alert.  Psychiatric:        Mood and Affect: Mood normal.     BP (!) 125/91   Pulse 65   Temp (!) 97.5 F (36.4 C) (Temporal)   Resp 20   Ht 6\' 3"   (1.905 m)   Wt 249 lb (112.9 kg)   BMI 31.12 kg/m        Assessment & Plan:  Azzie Roup in today with chief complaint of Abdominal Pain   1. Lower abdominal pain Probable diverticulitis If does not improve will need CT scan - DG Abd 1 View - CBC with Differential/Platelet - ciprofloxacin (CIPRO) 500 MG tablet; Take 1 tablet (500 mg total) by mouth 2 (two) times daily.  Dispense: 14 tablet; Refill: 0 - metroNIDAZOLE (FLAGYL) 500 MG tablet; Take 1 tablet (500 mg total) by mouth 2 (two) times daily.  Dispense: 14 tablet; Refill: 0    The above assessment and management plan was discussed with the patient. The patient verbalized understanding of and has agreed to the management plan. Patient is aware to call the clinic if symptoms persist or worsen. Patient is aware when to return to the clinic for a follow-up visit. Patient educated on when it is appropriate to go to the emergency department.   Mary-Margaret Hassell Done, FNP

## 2020-08-24 LAB — CBC WITH DIFFERENTIAL/PLATELET
Basophils Absolute: 0.1 10*3/uL (ref 0.0–0.2)
Basos: 1 %
EOS (ABSOLUTE): 0.3 10*3/uL (ref 0.0–0.4)
Eos: 3 %
Hematocrit: 45.7 % (ref 37.5–51.0)
Hemoglobin: 15.7 g/dL (ref 13.0–17.7)
Immature Grans (Abs): 0 10*3/uL (ref 0.0–0.1)
Immature Granulocytes: 0 %
Lymphocytes Absolute: 3.3 10*3/uL — ABNORMAL HIGH (ref 0.7–3.1)
Lymphs: 31 %
MCH: 30.3 pg (ref 26.6–33.0)
MCHC: 34.4 g/dL (ref 31.5–35.7)
MCV: 88 fL (ref 79–97)
Monocytes Absolute: 0.8 10*3/uL (ref 0.1–0.9)
Monocytes: 8 %
Neutrophils Absolute: 6.1 10*3/uL (ref 1.4–7.0)
Neutrophils: 57 %
Platelets: 386 10*3/uL (ref 150–450)
RBC: 5.19 x10E6/uL (ref 4.14–5.80)
RDW: 13.7 % (ref 11.6–15.4)
WBC: 10.6 10*3/uL (ref 3.4–10.8)

## 2020-08-24 LAB — THYROID PANEL WITH TSH
Free Thyroxine Index: 2.2 (ref 1.2–4.9)
T3 Uptake Ratio: 25 % (ref 24–39)
T4, Total: 8.8 ug/dL (ref 4.5–12.0)
TSH: 1.46 u[IU]/mL (ref 0.450–4.500)

## 2020-09-03 MED FILL — METOPROLOL SUCCINATE ER 50: 50 | 90 days supply | Qty: 90 | Fill #1

## 2020-09-11 ENCOUNTER — Encounter: Payer: Self-pay | Admitting: Family Medicine

## 2020-09-11 ENCOUNTER — Telehealth: Payer: Self-pay

## 2020-09-11 ENCOUNTER — Other Ambulatory Visit: Payer: Self-pay

## 2020-09-11 ENCOUNTER — Ambulatory Visit: Payer: No Typology Code available for payment source | Admitting: Physician Assistant

## 2020-09-11 ENCOUNTER — Ambulatory Visit (INDEPENDENT_AMBULATORY_CARE_PROVIDER_SITE_OTHER): Payer: No Typology Code available for payment source

## 2020-09-11 ENCOUNTER — Ambulatory Visit (INDEPENDENT_AMBULATORY_CARE_PROVIDER_SITE_OTHER): Payer: No Typology Code available for payment source | Admitting: Family Medicine

## 2020-09-11 VITALS — BP 134/86 | HR 57 | Temp 97.3°F | Ht 75.0 in | Wt 251.2 lb

## 2020-09-11 DIAGNOSIS — K582 Mixed irritable bowel syndrome: Secondary | ICD-10-CM

## 2020-09-11 DIAGNOSIS — K219 Gastro-esophageal reflux disease without esophagitis: Secondary | ICD-10-CM | POA: Diagnosis not present

## 2020-09-11 DIAGNOSIS — Z0001 Encounter for general adult medical examination with abnormal findings: Secondary | ICD-10-CM | POA: Diagnosis not present

## 2020-09-11 DIAGNOSIS — R059 Cough, unspecified: Secondary | ICD-10-CM

## 2020-09-11 DIAGNOSIS — R7989 Other specified abnormal findings of blood chemistry: Secondary | ICD-10-CM

## 2020-09-11 DIAGNOSIS — E039 Hypothyroidism, unspecified: Secondary | ICD-10-CM

## 2020-09-11 DIAGNOSIS — R5383 Other fatigue: Secondary | ICD-10-CM

## 2020-09-11 DIAGNOSIS — Z125 Encounter for screening for malignant neoplasm of prostate: Secondary | ICD-10-CM

## 2020-09-11 DIAGNOSIS — Z0000 Encounter for general adult medical examination without abnormal findings: Secondary | ICD-10-CM

## 2020-09-11 MED ORDER — RIFAXIMIN 550 MG PO TABS: 550.0000 mg | ORAL_TABLET | Freq: Three times a day (TID) | ORAL | 0 refills | Status: DC

## 2020-09-11 MED ORDER — RIFAXIMIN 550 MG PO TABS: 550.0000 mg | ORAL_TABLET | Freq: Three times a day (TID) | ORAL | 0 refills | Status: AC

## 2020-09-11 NOTE — Telephone Encounter (Signed)
Steve Taylor (Key: [id number]) Rx #: [id number] Xifaxan 550MG  tablets   Form MedImpact ePA Form 2017 NCPDP
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
## 2020-09-11 NOTE — Patient Instructions (Signed)
You had labs performed today.  You will be contacted with the results of the labs once they are available, usually in the next 3 business days for routine lab work.  If you have an active my chart account, they will be released to your MyChart.  If you prefer to have these labs released to you via telephone, please let us know.  If you had a pap smear or biopsy performed, expect to be contacted in about 7-10 days.   Health Maintenance, Male Adopting a healthy lifestyle and getting preventive care are important in promoting health and wellness. Ask your health care provider about:  The right schedule for you to have regular tests and exams.  Things you can do on your own to prevent diseases and keep yourself healthy. What should I know about diet, weight, and exercise? Eat a healthy diet   Eat a diet that includes plenty of vegetables, fruits, low-fat dairy products, and lean protein.  Do not eat a lot of foods that are high in solid fats, added sugars, or sodium. Maintain a healthy weight Body mass index (BMI) is a measurement that can be used to identify possible weight problems. It estimates body fat based on height and weight. Your health care provider can help determine your BMI and help you achieve or maintain a healthy weight. Get regular exercise Get regular exercise. This is one of the most important things you can do for your health. Most adults should:  Exercise for at least 150 minutes each week. The exercise should increase your heart rate and make you sweat (moderate-intensity exercise).  Do strengthening exercises at least twice a week. This is in addition to the moderate-intensity exercise.  Spend less time sitting. Even light physical activity can be beneficial. Watch cholesterol and blood lipids Have your blood tested for lipids and cholesterol at 47 years of age, then have this test every 5 years. You may need to have your cholesterol levels checked more often  if:  Your lipid or cholesterol levels are high.  You are older than 47 years of age.  You are at high risk for heart disease. What should I know about cancer screening? Many types of cancers can be detected early and may often be prevented. Depending on your health history and family history, you may need to have cancer screening at various ages. This may include screening for:  Colorectal cancer.  Prostate cancer.  Skin cancer.  Lung cancer. What should I know about heart disease, diabetes, and high blood pressure? Blood pressure and heart disease  High blood pressure causes heart disease and increases the risk of stroke. This is more likely to develop in people who have high blood pressure readings, are of African descent, or are overweight.  Talk with your health care provider about your target blood pressure readings.  Have your blood pressure checked: ? Every 3-5 years if you are 42-73 years of age. ? Every year if you are 38 years old or older.  If you are between the ages of 69 and 5 and are a current or former smoker, ask your health care provider if you should have a one-time screening for abdominal aortic aneurysm (AAA). Diabetes Have regular diabetes screenings. This checks your fasting blood sugar level. Have the screening done:  Once every three years after age 45 if you are at a normal weight and have a low risk for diabetes.  More often and at a younger age if you are overweight  or have a high risk for diabetes. What should I know about preventing infection? Hepatitis B If you have a higher risk for hepatitis B, you should be screened for this virus. Talk with your health care provider to find out if you are at risk for hepatitis B infection. Hepatitis C Blood testing is recommended for:  Everyone born from [birth year] through 1963.  Anyone with known risk factors for hepatitis C. Sexually transmitted infections (STIs)  You should be screened each year for STIs,  including gonorrhea and chlamydia, if: ? You are sexually active and are younger than 47 years of age. ? You are older than 47 years of age and your health care provider tells you that you are at risk for this type of infection. ? Your sexual activity has changed since you were last screened, and you are at increased risk for chlamydia or gonorrhea. Ask your health care provider if you are at risk.  Ask your health care provider about whether you are at high risk for HIV. Your health care provider may recommend a prescription medicine to help prevent HIV infection. If you choose to take medicine to prevent HIV, you should first get tested for HIV. You should then be tested every 3 months for as long as you are taking the medicine. Follow these instructions at home: Lifestyle  Do not use any products that contain nicotine or tobacco, such as cigarettes, e-cigarettes, and chewing tobacco. If you need help quitting, ask your health care provider.  Do not use street drugs.  Do not share needles.  Ask your health care provider for help if you need support or information about quitting drugs. Alcohol use  Do not drink alcohol if your health care provider tells you not to drink.  If you drink alcohol: ? Limit how much you have to 0-2 drinks a day. ? Be aware of how much alcohol is in your drink. In the [place] one drink equals one 12 oz bottle of beer (355 mL), one 5 oz glass of wine (148 mL), or one 1 oz glass of hard liquor (44 mL). General instructions  Schedule regular health, dental, and eye exams.  Stay current with your vaccines.  Tell your health care provider if: ? You often feel depressed. ? You have ever been abused or do not feel safe at home. Summary  Adopting a healthy lifestyle and getting preventive care are important in promoting health and wellness.  Follow your health care provider's instructions about healthy diet, exercising, and getting tested or screened for  diseases.  Follow your health care provider's instructions on monitoring your cholesterol and blood pressure. This information is not intended to replace advice given to you by your health care provider. Make sure you discuss any questions you have with your health care provider. Document Revised: 10/27/2018 Document Reviewed: 10/27/2018 Elsevier Patient Education  2020 [employer].

## 2020-09-12 LAB — CBC WITH DIFFERENTIAL/PLATELET
Basophils Absolute: 0.2 10*3/uL (ref 0.0–0.2)
Basos: 2 %
EOS (ABSOLUTE): 0.4 10*3/uL (ref 0.0–0.4)
Eos: 4 %
Hematocrit: 46.2 % (ref 37.5–51.0)
Hemoglobin: 16.1 g/dL (ref 13.0–17.7)
Immature Grans (Abs): 0 10*3/uL (ref 0.0–0.1)
Immature Granulocytes: 0 %
Lymphocytes Absolute: 3.2 10*3/uL — ABNORMAL HIGH (ref 0.7–3.1)
Lymphs: 35 %
MCH: 31.1 pg (ref 26.6–33.0)
MCHC: 34.8 g/dL (ref 31.5–35.7)
MCV: 89 fL (ref 79–97)
Monocytes Absolute: 0.6 10*3/uL (ref 0.1–0.9)
Monocytes: 6 %
Neutrophils Absolute: 4.8 10*3/uL (ref 1.4–7.0)
Neutrophils: 53 %
Platelets: 430 10*3/uL (ref 150–450)
RBC: 5.17 x10E6/uL (ref 4.14–5.80)
RDW: 14 % (ref 11.6–15.4)
WBC: 9.2 10*3/uL (ref 3.4–10.8)

## 2020-09-12 LAB — CMP14+EGFR
ALT: 45 IU/L — ABNORMAL HIGH (ref 0–44)
AST: 31 IU/L (ref 0–40)
Albumin/Globulin Ratio: 1.7 (ref 1.2–2.2)
Albumin: 4.3 g/dL (ref 4.0–5.0)
Alkaline Phosphatase: 80 IU/L (ref 44–121)
BUN/Creatinine Ratio: 10 (ref 9–20)
BUN: 11 mg/dL (ref 6–24)
Bilirubin Total: 0.6 mg/dL (ref 0.0–1.2)
CO2: 23 mmol/L (ref 20–29)
Calcium: 9.7 mg/dL (ref 8.7–10.2)
Chloride: 102 mmol/L (ref 96–106)
Creatinine, Ser: 1.12 mg/dL (ref 0.76–1.27)
GFR calc Af Amer: 90 mL/min/{1.73_m2} (ref >59)
GFR calc non Af Amer: 78 mL/min/{1.73_m2} (ref >59)
Globulin, Total: 2.5 g/dL (ref 1.5–4.5)
Glucose: 117 mg/dL — ABNORMAL HIGH (ref 65–99)
Potassium: 4.3 mmol/L (ref 3.5–5.2)
Sodium: 141 mmol/L (ref 134–144)
Total Protein: 6.8 g/dL (ref 6.0–8.5)

## 2020-09-12 LAB — LIPID PANEL
Chol/HDL Ratio: 5.4 ratio — ABNORMAL HIGH (ref 0.0–5.0)
Cholesterol, Total: 215 mg/dL — ABNORMAL HIGH (ref 100–199)
HDL: 40 mg/dL (ref >39)
LDL Chol Calc (NIH): 151 mg/dL — ABNORMAL HIGH (ref 0–99)
Triglycerides: 134 mg/dL (ref 0–149)
VLDL Cholesterol Cal: 24 mg/dL (ref 5–40)

## 2020-09-12 LAB — THYROID PANEL WITH TSH
Free Thyroxine Index: 2.1 (ref 1.2–4.9)
T3 Uptake Ratio: 27 % (ref 24–39)
T4, Total: 7.6 ug/dL (ref 4.5–12.0)
TSH: 2.43 u[IU]/mL (ref 0.450–4.500)

## 2020-09-12 LAB — TESTOSTERONE,FREE AND TOTAL
Testosterone, Free: 11.7 pg/mL (ref 6.8–21.5)
Testosterone: 357 ng/dL (ref 264–916)

## 2020-09-12 LAB — PSA: Prostate Specific Ag, Serum: 1.9 ng/mL (ref 0.0–4.0)

## 2020-09-13 ENCOUNTER — Telehealth: Payer: Self-pay | Admitting: *Deleted

## 2020-09-13 NOTE — Telephone Encounter (Signed)
PA for Xifaxin 561m Tablets- Denied  Key:  [id number]  This request has not been approved. Based on the information submitted for review, you did not meet our guideline rules for the requested drug. In order for your request to be approved, your provider would need to show that you have met the guideline rules below. The details below are written in medical language. If you have questions, please contact your provider. In some cases, the requested medication or alternatives offered may have additional approval requirements. Our guideline named RIFAXIMIN (Xifaxan 550 mg tablets) requires the following rules be met for approval: If you have irritable bowel syndrome with diarrhea, approval requires: You have tried tricyclic anti-depressants (such as amitriptyline, nortriptyline, etc.) or dicyclomine, unless there is a medical reason why you cannot (contraindication). We do not have information showing you have tried the preferred medications from the guideline. This is why your request is denied. Please work with your doctor to use a different medication or get [employer] information if it will allow [employer] approve this request. A written notification letter will follow with additional details.

## 2020-09-14 ENCOUNTER — Other Ambulatory Visit: Payer: Self-pay | Admitting: Family Medicine

## 2020-09-14 DIAGNOSIS — K582 Mixed irritable bowel syndrome: Secondary | ICD-10-CM

## 2020-09-14 MED ORDER — DICYCLOMINE HCL 10 MG PO CAPS: 10.0000 mg | ORAL_CAPSULE | Freq: Three times a day (TID) | ORAL | 0 refills | Status: DC | PRN

## 2020-09-14 NOTE — Telephone Encounter (Signed)
Lannette Donath (Key: [id number])- DENIED Rx #: [id number] Xifaxan 550MG  tablets

## 2020-09-14 NOTE — Telephone Encounter (Signed)
Wife aware of change

## 2020-09-14 NOTE — Telephone Encounter (Signed)
Please inform of injection.  I have replaced with Bentyl.  Use as needed

## 2020-10-03 ENCOUNTER — Ambulatory Visit (INDEPENDENT_AMBULATORY_CARE_PROVIDER_SITE_OTHER): Payer: No Typology Code available for payment source

## 2020-10-03 ENCOUNTER — Other Ambulatory Visit: Payer: Self-pay

## 2020-10-03 ENCOUNTER — Ambulatory Visit: Payer: No Typology Code available for payment source

## 2020-10-03 DIAGNOSIS — Z23 Encounter for immunization: Secondary | ICD-10-CM | POA: Diagnosis not present

## 2020-10-03 NOTE — Progress Notes (Signed)
   Covid-19 Vaccination Clinic  Name:  Steve Taylor    MRN: [id number] DOB: 09-Dec-1970  10/03/2020  Steve Taylor was observed post Covid-19 immunization for 15 minutes without incident. He was provided with Vaccine Information Sheet and instruction to access the V-Safe system.   Steve Taylor was instructed to call 911 with any severe reactions post vaccine: Marland Kitchen Difficulty breathing  . Swelling of face and throat  . A fast heartbeat  . A bad rash all over body  . Dizziness and weakness   Immunizations Administered    Name Date Dose VIS Date Route   Pfizer COVID-19 Vaccine 10/03/2020  2:36 PM 0.3 mL 09/05/2020 Intramuscular   Manufacturer: [place]   Lot: [id number]   [place]: [address]-9

## 2020-10-09 ENCOUNTER — Encounter: Payer: No Typology Code available for payment source | Admitting: Nurse Practitioner

## 2020-10-10 ENCOUNTER — Other Ambulatory Visit: Payer: Self-pay | Admitting: Family Medicine

## 2020-10-10 DIAGNOSIS — K582 Mixed irritable bowel syndrome: Secondary | ICD-10-CM

## 2020-10-22 ENCOUNTER — Ambulatory Visit (INDEPENDENT_AMBULATORY_CARE_PROVIDER_SITE_OTHER): Payer: No Typology Code available for payment source | Admitting: Family Medicine

## 2020-10-22 ENCOUNTER — Encounter: Payer: Self-pay | Admitting: Family Medicine

## 2020-10-22 DIAGNOSIS — J019 Acute sinusitis, unspecified: Secondary | ICD-10-CM | POA: Diagnosis not present

## 2020-10-22 DIAGNOSIS — B9689 Other specified bacterial agents as the cause of diseases classified elsewhere: Secondary | ICD-10-CM

## 2020-10-22 MED ORDER — AMOXICILLIN-POT CLAVULANATE 875-125 MG PO TABS: 1.0000 | ORAL_TABLET | Freq: Two times a day (BID) | ORAL | 0 refills | Status: DC

## 2020-10-22 MED ORDER — PROMETHAZINE-DM 6.25-15 MG/5ML PO SYRP: 2.5000 mL | ORAL_SOLUTION | Freq: Four times a day (QID) | ORAL | 0 refills | Status: DC | PRN
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
## 2020-10-30 ENCOUNTER — Other Ambulatory Visit: Payer: Self-pay | Admitting: Family Medicine

## 2020-10-30 DIAGNOSIS — K219 Gastro-esophageal reflux disease without esophagitis: Secondary | ICD-10-CM

## 2020-10-30 MED FILL — PANTOPRAZOLE SOD DR 40 MG T: 40 | 90 days supply | Qty: 90 | Fill #0

## 2020-11-28 ENCOUNTER — Other Ambulatory Visit: Payer: Self-pay | Admitting: Family Medicine

## 2020-11-28 DIAGNOSIS — R002 Palpitations: Secondary | ICD-10-CM

## 2020-11-28 DIAGNOSIS — E039 Hypothyroidism, unspecified: Secondary | ICD-10-CM

## 2020-11-28 MED FILL — METOPROLOL SUCCINATE ER 50: 50 | 90 days supply | Qty: 90 | Fill #0

## 2020-11-28 MED FILL — LEVOTHYROXINE SODIUM 100 MC: 100 | 90 days supply | Qty: 90 | Fill #0

## 2021-02-04 ENCOUNTER — Other Ambulatory Visit (HOSPITAL_BASED_OUTPATIENT_CLINIC_OR_DEPARTMENT_OTHER): Payer: Self-pay

## 2021-03-04 ENCOUNTER — Other Ambulatory Visit (HOSPITAL_COMMUNITY): Payer: Self-pay

## 2021-03-04 MED FILL — Metoprolol Succinate Tab ER 24HR 50 MG (Tartrate Equiv): ORAL | 90 days supply | Qty: 90 | Fill #0 | Status: AC

## 2021-03-04 MED FILL — Pantoprazole Sodium EC Tab 40 MG (Base Equiv): ORAL | 90 days supply | Qty: 90 | Fill #0 | Status: AC

## 2021-03-04 MED FILL — Levothyroxine Sodium Tab 100 MCG: ORAL | 90 days supply | Qty: 90 | Fill #0 | Status: AC

## 2021-03-06 ENCOUNTER — Other Ambulatory Visit (HOSPITAL_COMMUNITY): Payer: Self-pay

## 2021-03-07 ENCOUNTER — Other Ambulatory Visit (HOSPITAL_COMMUNITY): Payer: Self-pay

## 2021-03-14 ENCOUNTER — Other Ambulatory Visit (HOSPITAL_COMMUNITY): Payer: Self-pay

## 2021-03-15 ENCOUNTER — Encounter: Payer: Self-pay | Admitting: Nurse Practitioner

## 2021-03-15 ENCOUNTER — Ambulatory Visit (INDEPENDENT_AMBULATORY_CARE_PROVIDER_SITE_OTHER): Payer: No Typology Code available for payment source | Admitting: Nurse Practitioner

## 2021-03-15 ENCOUNTER — Other Ambulatory Visit: Payer: Self-pay

## 2021-03-15 VITALS — BP 140/97 | HR 71 | Temp 98.3°F | Resp 20 | Ht 75.0 in | Wt 258.0 lb

## 2021-03-15 DIAGNOSIS — J301 Allergic rhinitis due to pollen: Secondary | ICD-10-CM

## 2021-03-15 MED ORDER — PREDNISONE 20 MG PO TABS: 40.0000 mg | ORAL_TABLET | Freq: Every day | ORAL | 0 refills | Status: AC

## 2021-03-15 MED ORDER — METHYLPREDNISOLONE ACETATE 40 MG/ML IJ SUSP
40.0000 mg | Freq: Once | INTRAMUSCULAR | Status: AC
  Administered 2021-03-15: 40 mg via INTRAMUSCULAR

## 2021-03-15 MED ORDER — FLUTICASONE PROPIONATE 50 MCG/ACT NA SUSP: 2.0000 | Freq: Every day | NASAL | 6 refills | Status: DC

## 2021-03-15 NOTE — Patient Instructions (Signed)

## 2021-03-15 NOTE — Progress Notes (Signed)
   Subjective:    Patient ID: Steve Taylor, male    DOB: [birth year], 50 y.o.   MRN: [id number]   Chief Complaint: sinus drainage (Hoarse/)   HPI Patient comes in today c/o congestion and cough. Nasal drainage is make him sick on his stomach. Started taking claritin yesterday morning.   Review of Systems  Constitutional: Negative for chills and fever.  HENT: Positive for congestion, postnasal drip, rhinorrhea and voice change. Negative for ear discharge, ear pain, sinus pressure, sinus pain and sore throat.   Respiratory: Negative for cough, shortness of breath and wheezing.   Neurological: Negative for dizziness and headaches.  All other systems reviewed and are negative.      Objective:   Physical Exam Vitals and nursing note reviewed.  Constitutional:      Appearance: Normal appearance.  HENT:     Right Ear: Tympanic membrane normal.     Left Ear: Tympanic membrane normal.     Nose: Congestion and rhinorrhea present.     Mouth/Throat:     Mouth: Mucous membranes are moist.  Eyes:     Pupils: Pupils are equal, round, and reactive to light.  Cardiovascular:     Rate and Rhythm: Normal rate and regular rhythm.     Heart sounds: Normal heart sounds.  Pulmonary:     Effort: Pulmonary effort is normal.     Breath sounds: Normal breath sounds.  Skin:    General: Skin is warm.  Neurological:     General: No focal deficit present.     Mental Status: He is alert and oriented to person, place, and time.  Psychiatric:        Mood and Affect: Mood normal.    BP (!) 140/97   Pulse 71   Temp 98.3 F (36.8 C) (Temporal)   Resp 20   Ht 6\' 3"  (1.905 m)   Wt 258 lb (117 kg)   BMI 32.25 kg/m         Assessment & Plan:  Azzie Roup in today with chief complaint of sinus drainage (Hoarse/)   1. Seasonal allergic rhinitis due to pollen Wear mask when doing yard work - fluticasone (FLONASE) 50 MCG/ACT nasal spray; Place 2 sprays into both nostrils daily.  Dispense:  16 g; Refill: 6 - methylPREDNISolone acetate (DEPO-MEDROL) injection 40 mg - predniSONE (DELTASONE) 20 MG tablet; Take 2 tablets (40 mg total) by mouth daily with breakfast for 5 days. 2 po daily for 5 days  Dispense: 10 tablet; Refill: 0    The above assessment and management plan was discussed with the patient. The patient verbalized understanding of and has agreed to the management plan. Patient is aware to call the clinic if symptoms persist or worsen. Patient is aware when to return to the clinic for a follow-up visit. Patient educated on when it is appropriate to go to the emergency department.   Mary-Margaret Hassell Done, FNP

## 2021-05-06 ENCOUNTER — Encounter: Payer: Self-pay | Admitting: Family Medicine

## 2021-05-06 ENCOUNTER — Telehealth (INDEPENDENT_AMBULATORY_CARE_PROVIDER_SITE_OTHER): Payer: No Typology Code available for payment source | Admitting: Family Medicine

## 2021-05-06 DIAGNOSIS — J301 Allergic rhinitis due to pollen: Secondary | ICD-10-CM | POA: Diagnosis not present

## 2021-05-06 MED ORDER — FLUTICASONE PROPIONATE 50 MCG/ACT NA SUSP: 2.0000 | Freq: Every day | NASAL | 6 refills | Status: DC

## 2021-05-06 MED ORDER — PREDNISONE 20 MG PO TABS: ORAL_TABLET | ORAL | 0 refills | Status: DC

## 2021-05-06 NOTE — Progress Notes (Signed)
Virtual Visit via MyChart video note  I connected with Steve Taylor on 05/06/21 at 1606 by video and verified that I am speaking with the correct person using two identifiers. Steve Taylor is currently located at home and patient are currently with her during visit. The provider, Fransisca Kaufmann Rebakah Cokley, MD is located in their office at time of visit.  Call ended at 1621  I discussed the limitations, risks, security and privacy concerns of performing an evaluation and management service by video and the availability of in person appointments. I also discussed with the patient that there may be a patient responsible charge related to this service. The patient expressed understanding and agreed to proceed.   History and Present Illness: Patient is calling for sore throat and it has been going on for 2 days.  He has congestion and sinus drainage and congestion. The [facility] was running real high and congestion has worsened.  This morning he started claritin. He was having a lot of postnasal drainage.  He also took ibuprofen and claritin. He denies fevers or chills or achiness.  He is using flonase and it has been helping some.  He had this one month ago.  He does not typically get allergies [place]   1. Seasonal allergic rhinitis due to pollen     Outpatient Encounter Medications as of 05/06/2021  Medication Sig   predniSONE (DELTASONE) 20 MG tablet 2 po at same time daily for 5 days   dicyclomine (BENTYL) 10 MG capsule TAKE 1 CAPSULE BY MOUTH 3 TIMES DAILY AS NEEDED FOR SPASMS (ABDOMINAL CRAMPING WITH DIARRHEA).   fluticasone (FLONASE) 50 MCG/ACT nasal spray Place 2 sprays into both nostrils daily.   levothyroxine (SYNTHROID) 100 MCG tablet TAKE 1 TABLET BY MOUTH ONCE A DAY   metoprolol succinate (TOPROL-XL) 50 MG 24 hr tablet TAKE 1 TABLET BY MOUTH DAILY. TAKE WITH OR IMMEDIATELY FOLLOWING A MEAL.   pantoprazole (PROTONIX) 40 MG tablet TAKE 1 TABLET BY MOUTH  DAILY.   promethazine-dextromethorphan (PROMETHAZINE-DM) 6.25-15 MG/5ML syrup Take 2.5 mLs by mouth 4 (four) times daily as needed for cough. (Patient not taking: Reported on 03/15/2021)   [DISCONTINUED] fluticasone (FLONASE) 50 MCG/ACT nasal spray Place 2 sprays into both nostrils daily.   No facility-administered encounter medications on file as of 05/06/2021.    Review of Systems  Constitutional:  Negative for chills and fever.  HENT:  Positive for congestion, postnasal drip, rhinorrhea, sinus pressure, sneezing and sore throat. Negative for ear discharge, ear pain and voice change.   Eyes:  Negative for pain, discharge, redness and visual disturbance.  Respiratory:  Positive for cough. Negative for shortness of breath and wheezing.   Cardiovascular:  Negative for chest pain and leg swelling.  Musculoskeletal:  Negative for gait problem.  Skin:  Negative for rash.  All other systems reviewed and are negative.  Observations/Objective: Patient sounds comfortable and in no acute distress  Assessment and Plan: Problem List Items Addressed This Visit   None Visit Diagnoses     Seasonal allergic rhinitis due to pollen    -  Primary   Relevant Medications   fluticasone (FLONASE) 50 MCG/ACT nasal spray   predniSONE (DELTASONE) 20 MG tablet       Will treat like allergic rhinitis, likely due to new situation at the beach house and running the [facility] more.  Recommend continue antihistamine and sent Flonase and prednisone for him.  If worsens or does  not improve then please call us Follow up plan: Return if symptoms worsen or fail to improve.     I discussed the assessment and treatment plan with the patient. The patient was provided an opportunity to ask questions and all were answered. The patient agreed with the plan and demonstrated an understanding of the instructions.   The patient was advised to call back or seek an in-person evaluation if the symptoms worsen or if the condition  fails to improve as anticipated.  The above assessment and management plan was discussed with the patient. The patient verbalized understanding of and has agreed to the management plan. Patient is aware to call the clinic if symptoms persist or worsen. Patient is aware when to return to the clinic for a follow-up visit. Patient educated on when it is appropriate to go to the emergency department.    I provided 15 minutes of non-face-to-face time during this encounter.    Worthy Rancher, MD

## 2021-05-21 ENCOUNTER — Ambulatory Visit: Payer: No Typology Code available for payment source | Admitting: Physician Assistant

## 2021-06-07 ENCOUNTER — Ambulatory Visit: Payer: No Typology Code available for payment source | Admitting: Family Medicine

## 2021-06-07 ENCOUNTER — Ambulatory Visit (INDEPENDENT_AMBULATORY_CARE_PROVIDER_SITE_OTHER): Payer: No Typology Code available for payment source | Admitting: Nurse Practitioner

## 2021-06-07 ENCOUNTER — Other Ambulatory Visit: Payer: Self-pay

## 2021-06-07 ENCOUNTER — Encounter: Payer: Self-pay | Admitting: Nurse Practitioner

## 2021-06-07 VITALS — BP 140/96 | HR 78 | Temp 97.8°F | Ht 75.0 in | Wt 254.0 lb

## 2021-06-07 DIAGNOSIS — T148XXA Other injury of unspecified body region, initial encounter: Secondary | ICD-10-CM | POA: Insufficient documentation

## 2021-06-07 DIAGNOSIS — Z23 Encounter for immunization: Secondary | ICD-10-CM

## 2021-06-07 DIAGNOSIS — S60946A Unspecified superficial injury of right little finger, initial encounter: Secondary | ICD-10-CM | POA: Diagnosis not present

## 2021-06-07 NOTE — Patient Instructions (Signed)
Puncture Wound A puncture wound is an injury that is caused by a sharp, thin object that goes through your skin. A puncture wound usually does not leave a large opening in your skin, so it may not bleed a lot. However, when you get a puncture wound, dirt or other materials (foreign bodies) can be forced into your wound and can break off inside. This increases the chance of infection, such as tetanus. There are many sharp, pointed objects that can cause puncture wounds, including teeth, nails, splinters of glass,fishhooks, and needles. Treatment may include the following steps: Washing out the wound with a germ-free (sterile) salt-water solution. Having surgery to open the wound and remove materials from it. Closing the wound with stitches (sutures). Covering the wound with antibiotic ointment and a bandage (dressing). Depending on what caused the injury, you may also need a tetanus shot or arabies shot. Follow these instructions at home: Medicines Take or apply over-the-counter and prescription medicines only as told by your doctor. If you were prescribed an antibiotic medicine, take or apply it as told by your doctor. Do not stop using the antibiotic even if your condition starts to get better. Bathing Keep the bandage dry as told by your doctor. Do not take baths, swim, or use a hot tub until your doctor approves. Ask your doctor if you may take showers. You may only be allowed to take sponge baths. Wound care  There are many ways to close and cover a wound. For example, a wound can be closed with stitches, skin glue, or skin tape (adhesive strips). Follow instructions from your doctor about how to take care of your wound. Make sure you: Wash your hands with soap and water before and after you change your bandage. If you cannot use soap and water, use hand sanitizer. Change your bandage as told by your doctor. Leave stitches, skin glue, or skin tape strips in place. They may need to stay in  place for 2 weeks or longer. If tape strips get loose and curl up, you may trim the loose edges. Do not remove tape strips completely unless your doctor says it is okay. Clean the wound as told by your doctor. Do not scratch or pick at the wound. Check your wound every day for signs of infection. Watch for: Redness, swelling, or pain. Fluid or blood. Warmth. Pus or a bad smell.  General instructions Raise (elevate) the injured area above the level of your heart while you are sitting or lying down. If your puncture wound is in your foot, ask your doctor if you need to avoid putting weight on your foot and for how long. Use crutches as told by your doctor. Keep all follow-up visits as told by your doctor. This is important. Contact a doctor if: You got a tetanus shot and you have any of these problems at the injection site: Swelling. Very bad pain. Redness. Bleeding. You have a fever. Your stitches come out. You notice a bad smell coming from your wound or your bandage. You notice something coming out of the wound, such as wood or glass. Medicine does not help your pain. You have more redness, swelling, or pain at the site of your wound. You have fluid, blood, or pus coming from your wound. You notice a change in the color of your skin near your wound. You need to change the bandage often because fluid, blood, or pus is coming from the wound. You start to have a new rash. You  start to lose feeling (have numbness) around the wound. You have warmth around your wound. Get help right away if: You have very bad swelling around the wound. Your pain quickly gets worse and is very bad. You start to get painful skin lumps. You have a red streak going away from your wound. The wound is on your hand or foot and you: Cannot move a finger or toe like normal. Notice that your fingers or toes look pale or blue. Summary A puncture wound is an injury that is caused by a sharp, thin object that  goes through your skin. Treatment may include washing out the wound, having surgery to open the wound to clean it, closing the wound, and covering the wound with a bandage. Follow instructions from your doctor about how to take care of your wound. Contact your doctor if you have more redness, swelling, or pain at the site of your wound. Keep all follow-up visits as told by your doctor. This is important. This information is not intended to replace advice given to you by your health care provider. Make sure you discuss any questions you have with your healthcare provider. Document Revised: 03/13/2020 Document Reviewed: 06/10/2018 Elsevier Patient Education  [employer].
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
## 2021-06-07 NOTE — Assessment & Plan Note (Signed)
After assessment no signs and symptoms of infection around puncture wound.  Education provided to patient on what to look out for, Tylenol or ibuprofen for pain as needed.  Ice compress.  Tetanus shot completed.  Follow-up with worsening unresolved symptoms.

## 2021-06-10 MED FILL — Levothyroxine Sodium Tab 100 MCG: ORAL | 90 days supply | Qty: 90 | Fill #1 | Status: AC

## 2021-06-10 MED FILL — Pantoprazole Sodium EC Tab 40 MG (Base Equiv): ORAL | 90 days supply | Qty: 90 | Fill #1 | Status: AC

## 2021-06-10 MED FILL — Metoprolol Succinate Tab ER 24HR 50 MG (Tartrate Equiv): ORAL | 90 days supply | Qty: 90 | Fill #1 | Status: AC

## 2021-06-11 ENCOUNTER — Other Ambulatory Visit (HOSPITAL_COMMUNITY): Payer: Self-pay

## 2021-06-13 ENCOUNTER — Ambulatory Visit: Payer: No Typology Code available for payment source | Admitting: Family Medicine

## 2021-07-24 ENCOUNTER — Ambulatory Visit: Payer: No Typology Code available for payment source | Admitting: Physician Assistant

## 2021-09-19 ENCOUNTER — Other Ambulatory Visit (HOSPITAL_COMMUNITY): Payer: Self-pay

## 2021-09-19 ENCOUNTER — Other Ambulatory Visit: Payer: Self-pay | Admitting: Family Medicine

## 2021-09-19 DIAGNOSIS — E039 Hypothyroidism, unspecified: Secondary | ICD-10-CM

## 2021-09-19 DIAGNOSIS — R002 Palpitations: Secondary | ICD-10-CM

## 2021-09-19 MED ORDER — METOPROLOL SUCCINATE ER 50 MG PO TB24
ORAL_TABLET | Freq: Every day | ORAL | 0 refills | Status: DC
  Filled 2021-09-19: qty 90, 90d supply, fill #0

## 2021-09-19 MED ORDER — LEVOTHYROXINE SODIUM 100 MCG PO TABS
ORAL_TABLET | Freq: Every day | ORAL | 0 refills | Status: DC
  Filled 2021-09-19: qty 90, 90d supply, fill #0

## 2021-09-19 MED FILL — Pantoprazole Sodium EC Tab 40 MG (Base Equiv): ORAL | 90 days supply | Qty: 90 | Fill #2 | Status: AC

## 2021-10-02 ENCOUNTER — Other Ambulatory Visit: Payer: Self-pay

## 2021-11-06 ENCOUNTER — Ambulatory Visit: Payer: No Typology Code available for payment source | Admitting: Physician Assistant

## 2021-12-26 ENCOUNTER — Ambulatory Visit (INDEPENDENT_AMBULATORY_CARE_PROVIDER_SITE_OTHER): Payer: No Typology Code available for payment source | Admitting: Family Medicine

## 2021-12-26 ENCOUNTER — Encounter: Payer: Self-pay | Admitting: Family Medicine

## 2021-12-26 ENCOUNTER — Other Ambulatory Visit (HOSPITAL_COMMUNITY): Payer: Self-pay

## 2021-12-26 VITALS — BP 139/90 | HR 83 | Temp 97.8°F | Ht 75.0 in | Wt 257.2 lb

## 2021-12-26 DIAGNOSIS — I10 Essential (primary) hypertension: Secondary | ICD-10-CM | POA: Diagnosis not present

## 2021-12-26 DIAGNOSIS — K219 Gastro-esophageal reflux disease without esophagitis: Secondary | ICD-10-CM | POA: Diagnosis not present

## 2021-12-26 DIAGNOSIS — R002 Palpitations: Secondary | ICD-10-CM | POA: Diagnosis not present

## 2021-12-26 DIAGNOSIS — E039 Hypothyroidism, unspecified: Secondary | ICD-10-CM | POA: Diagnosis not present

## 2021-12-26 MED ORDER — LEVOTHYROXINE SODIUM 100 MCG PO TABS
100.0000 ug | ORAL_TABLET | Freq: Every day | ORAL | 3 refills | Status: DC
  Filled 2021-12-26: qty 90, 90d supply, fill #0
  Filled 2022-04-07: qty 90, 90d supply, fill #1
  Filled 2022-07-06: qty 90, 90d supply, fill #2
  Filled 2022-10-29: qty 90, 90d supply, fill #3

## 2021-12-26 MED ORDER — METOPROLOL SUCCINATE ER 25 MG PO TB24
25.0000 mg | ORAL_TABLET | Freq: Every day | ORAL | 3 refills | Status: DC
  Filled 2021-12-26: qty 90, 90d supply, fill #0
  Filled 2022-04-07: qty 90, 90d supply, fill #1
  Filled 2022-07-06: qty 90, 90d supply, fill #2
  Filled 2022-10-29: qty 90, 90d supply, fill #3

## 2021-12-26 MED ORDER — PANTOPRAZOLE SODIUM 40 MG PO TBEC
DELAYED_RELEASE_TABLET | Freq: Every day | ORAL | 3 refills | Status: DC
  Filled 2021-12-26: qty 90, 90d supply, fill #0
  Filled 2022-04-07: qty 90, 90d supply, fill #1
  Filled 2022-07-06: qty 90, 90d supply, fill #2
  Filled 2022-10-29: qty 90, 90d supply, fill #3

## 2021-12-26 NOTE — Patient Instructions (Signed)
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
## 2021-12-27 LAB — CMP14+EGFR
ALT: 46 IU/L — ABNORMAL HIGH (ref 0–44)
AST: 33 IU/L (ref 0–40)
Albumin/Globulin Ratio: 1.6 (ref 1.2–2.2)
Albumin: 4.3 g/dL (ref 4.0–5.0)
Alkaline Phosphatase: 89 IU/L (ref 44–121)
BUN/Creatinine Ratio: 16 (ref 9–20)
BUN: 17 mg/dL (ref 6–24)
Bilirubin Total: 0.8 mg/dL (ref 0.0–1.2)
CO2: 25 mmol/L (ref 20–29)
Calcium: 9.5 mg/dL (ref 8.7–10.2)
Chloride: 102 mmol/L (ref 96–106)
Creatinine, Ser: 1.08 mg/dL (ref 0.76–1.27)
Globulin, Total: 2.7 g/dL (ref 1.5–4.5)
Glucose: 115 mg/dL — ABNORMAL HIGH (ref 70–99)
Potassium: 4.2 mmol/L (ref 3.5–5.2)
Sodium: 140 mmol/L (ref 134–144)
Total Protein: 7 g/dL (ref 6.0–8.5)
eGFR: 84 mL/min/{1.73_m2} (ref >59)

## 2021-12-27 LAB — THYROID PANEL WITH TSH
Free Thyroxine Index: 3.4 (ref 1.2–4.9)
T3 Uptake Ratio: 31 % (ref 24–39)
T4, Total: 11.1 ug/dL (ref 4.5–12.0)
TSH: 0.551 u[IU]/mL (ref 0.450–4.500)

## 2021-12-27 LAB — LIPID PANEL
Chol/HDL Ratio: 5.6 ratio — ABNORMAL HIGH (ref 0.0–5.0)
Cholesterol, Total: 192 mg/dL (ref 100–199)
HDL: 34 mg/dL — ABNORMAL LOW (ref >39)
LDL Chol Calc (NIH): 135 mg/dL — ABNORMAL HIGH (ref 0–99)
Triglycerides: 129 mg/dL (ref 0–149)
VLDL Cholesterol Cal: 23 mg/dL (ref 5–40)

## 2021-12-27 LAB — CBC WITH DIFFERENTIAL/PLATELET
Basophils Absolute: 0.2 10*3/uL (ref 0.0–0.2)
Basos: 2 %
EOS (ABSOLUTE): 0.3 10*3/uL (ref 0.0–0.4)
Eos: 3 %
Hematocrit: 46.7 % (ref 37.5–51.0)
Hemoglobin: 16 g/dL (ref 13.0–17.7)
Immature Grans (Abs): 0 10*3/uL (ref 0.0–0.1)
Immature Granulocytes: 0 %
Lymphocytes Absolute: 3.6 10*3/uL — ABNORMAL HIGH (ref 0.7–3.1)
Lymphs: 44 %
MCH: 30 pg (ref 26.6–33.0)
MCHC: 34.3 g/dL (ref 31.5–35.7)
MCV: 88 fL (ref 79–97)
Monocytes Absolute: 0.5 10*3/uL (ref 0.1–0.9)
Monocytes: 6 %
Neutrophils Absolute: 3.7 10*3/uL (ref 1.4–7.0)
Neutrophils: 45 %
Platelets: 404 10*3/uL (ref 150–450)
RBC: 5.34 x10E6/uL (ref 4.14–5.80)
RDW: 14.2 % (ref 11.6–15.4)
WBC: 8.2 10*3/uL (ref 3.4–10.8)

## 2022-02-12 IMAGING — DX DG ABDOMEN 1V
2 series · 2 of 2 positions shown · non-contrast
Comparison: None.

CLINICAL DATA: Abdominal pain

EXAM:
ABDOMEN - 1 VIEW

[abdomen kub (1 of 2)]
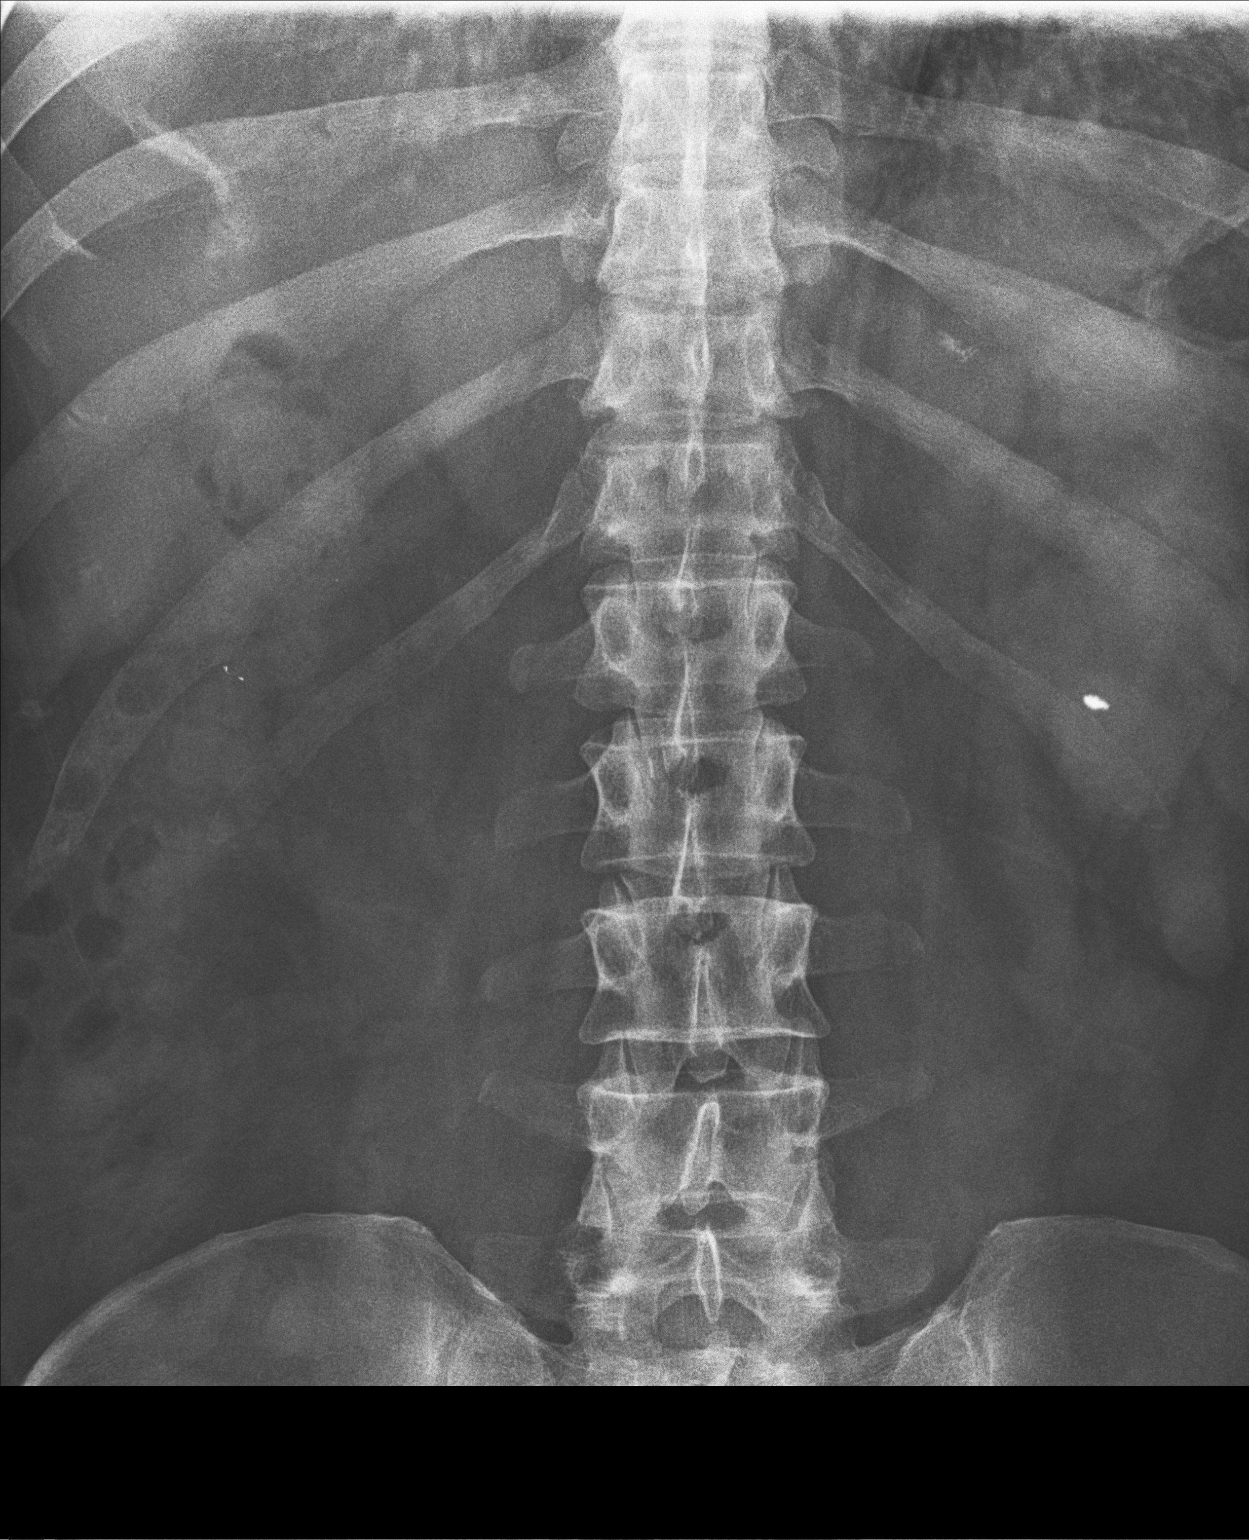

[abdomen kub (2 of 2)]
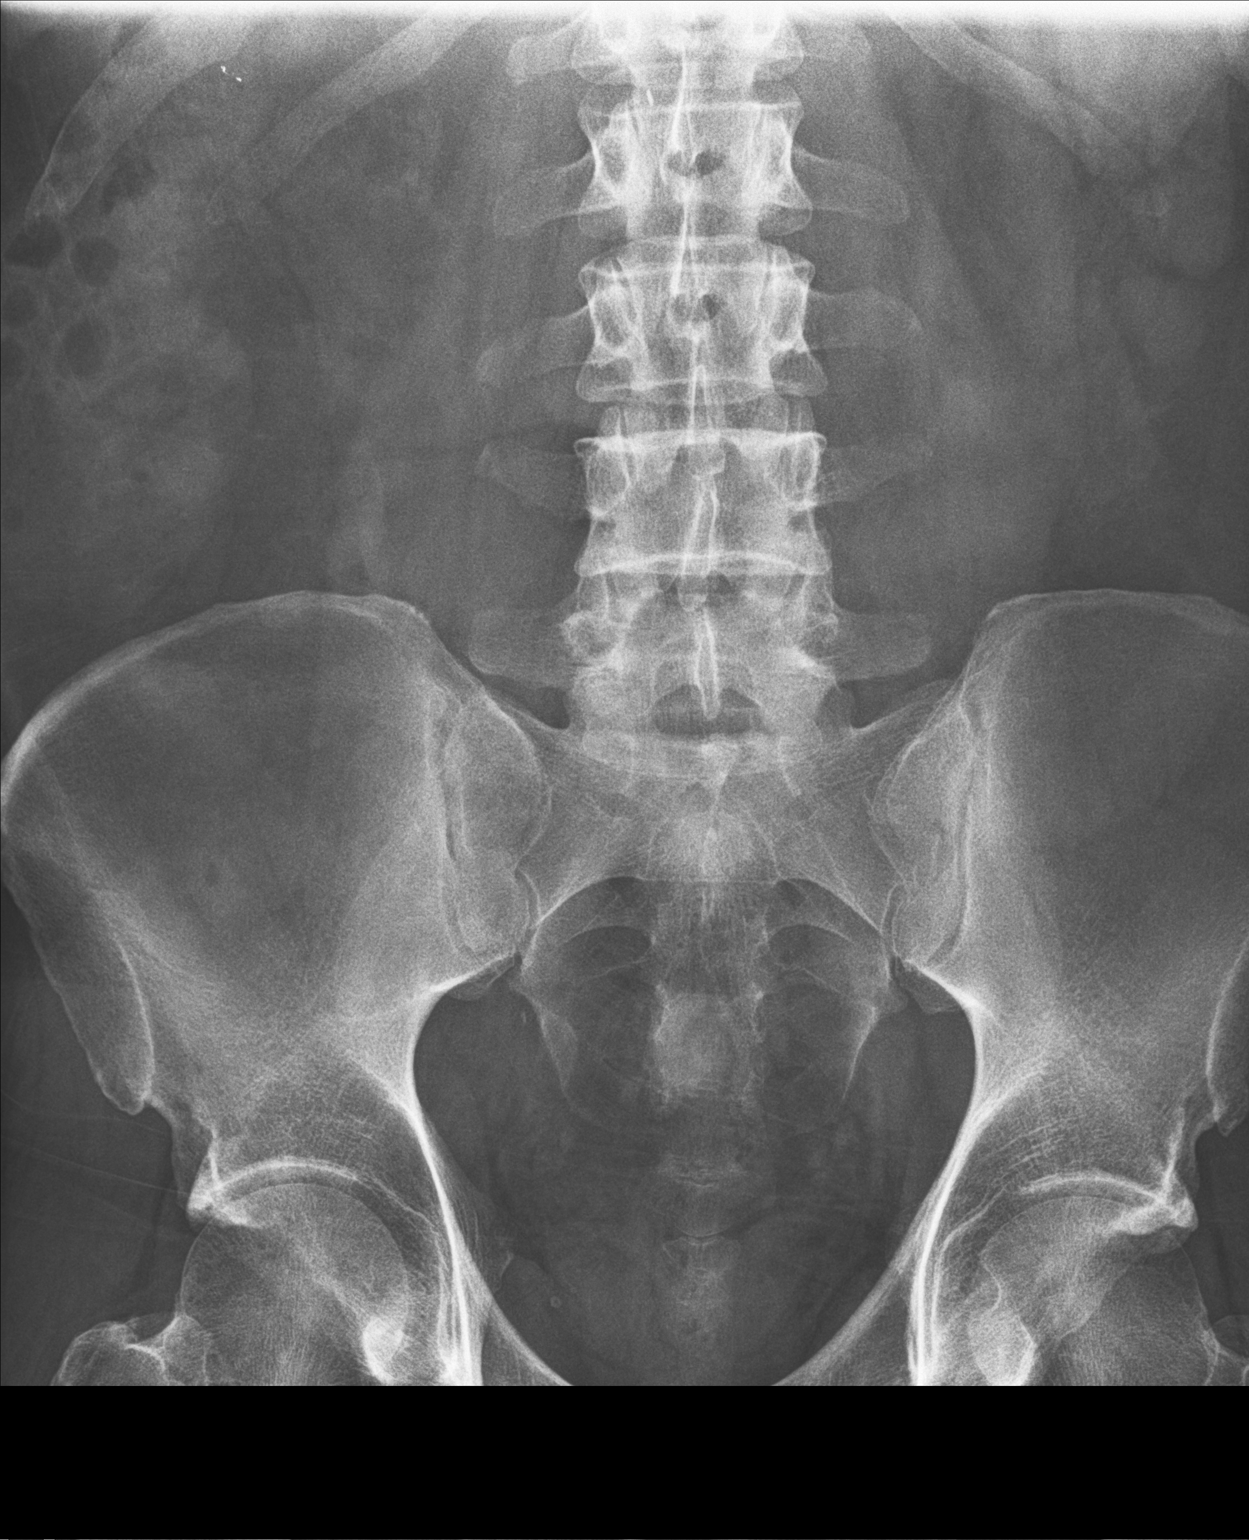

[2 of 2 positions shown; findings below may reference images not displayed]

FINDINGS: The bowel gas pattern is normal. Small cluster of calcification
projects over the upper left renal shadow, possibly renal calculi
although nonspecific. There are small metallic densities projecting
over the right and left hemiabdomen which may be external to the
patient or possibly within bowel.
IMPRESSION: 1. Normal bowel gas pattern.
2. Small cluster of calcification projects over the upper left renal
shadow, possibly renal calculi although nonspecific. Consider renal
ultrasound for further evaluation if there is concern for
obstructive uropathy.
3. Small metallic densities projecting over the right and left
hemiabdomen may be external to the patient or possibly within bowel.

## 2022-03-03 IMAGING — DX DG CHEST 2V
2 series · 2 of 2 positions shown · non-contrast
Comparison: 10/04/2018

CLINICAL DATA: Persistent cough post [id number]

EXAM:
CHEST - 2 VIEW

[chest pa]
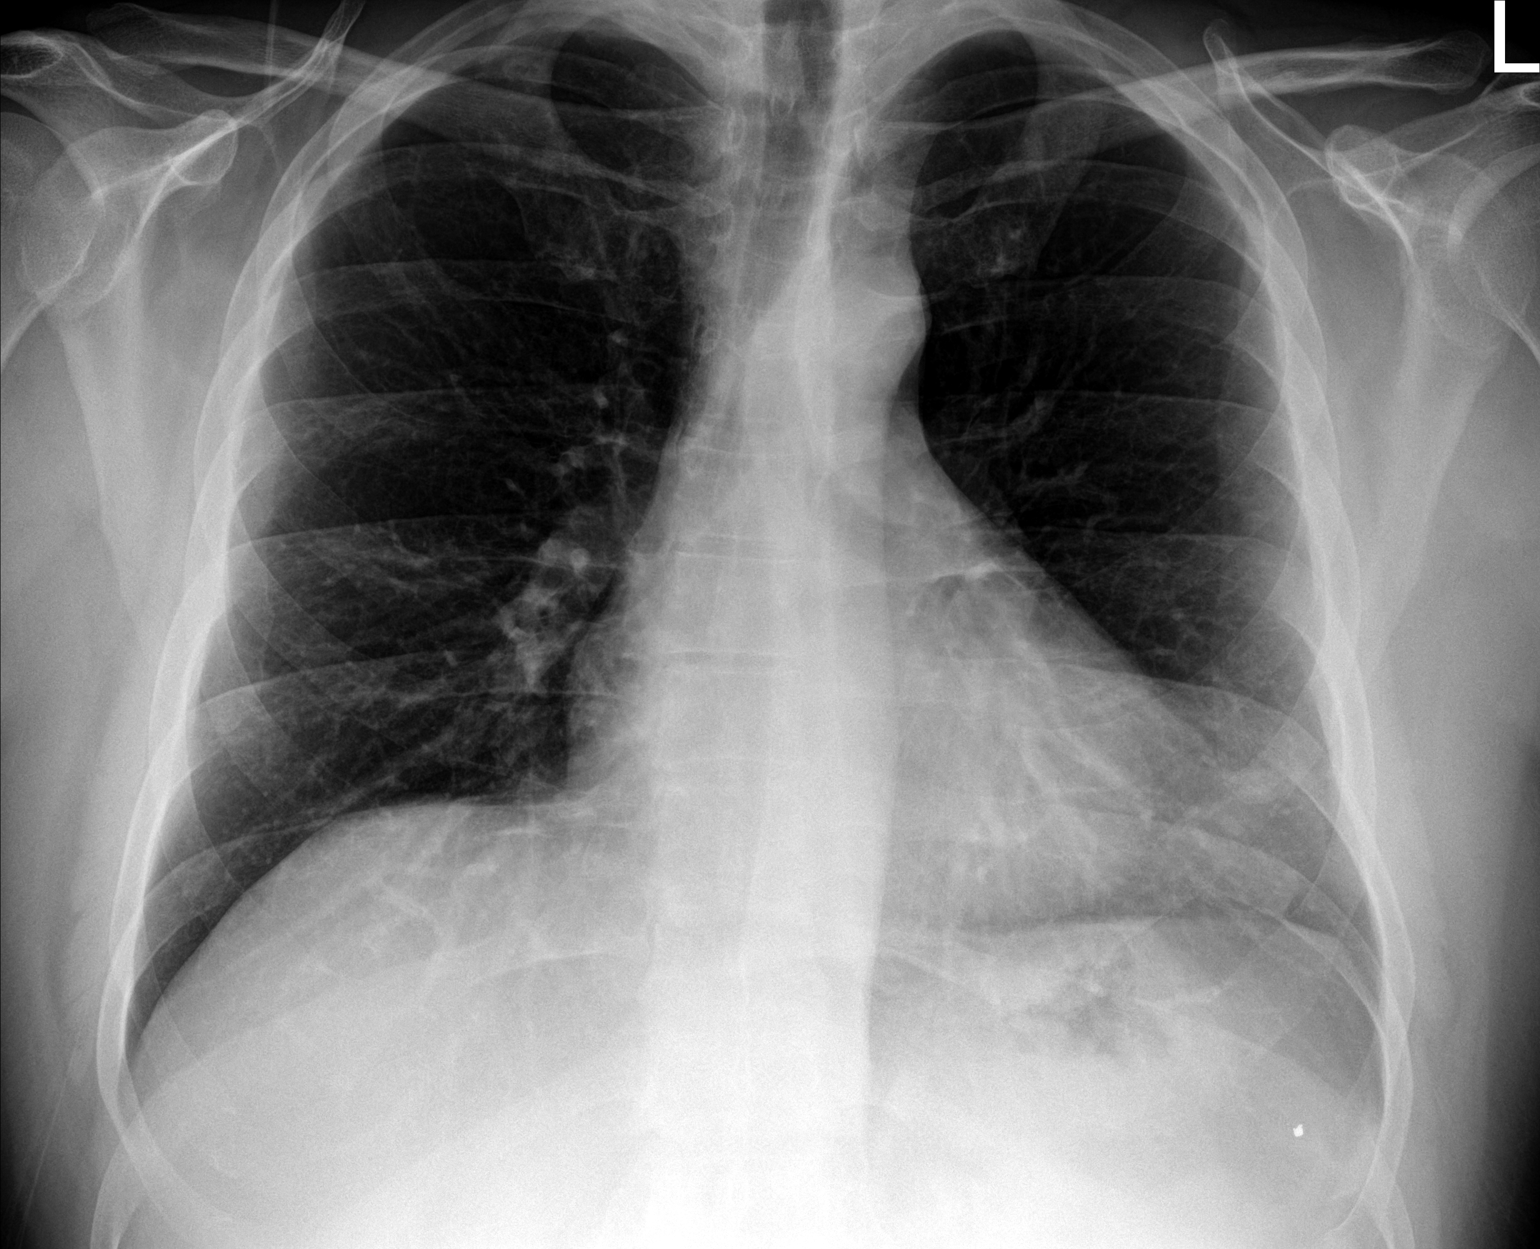

[chest lat]
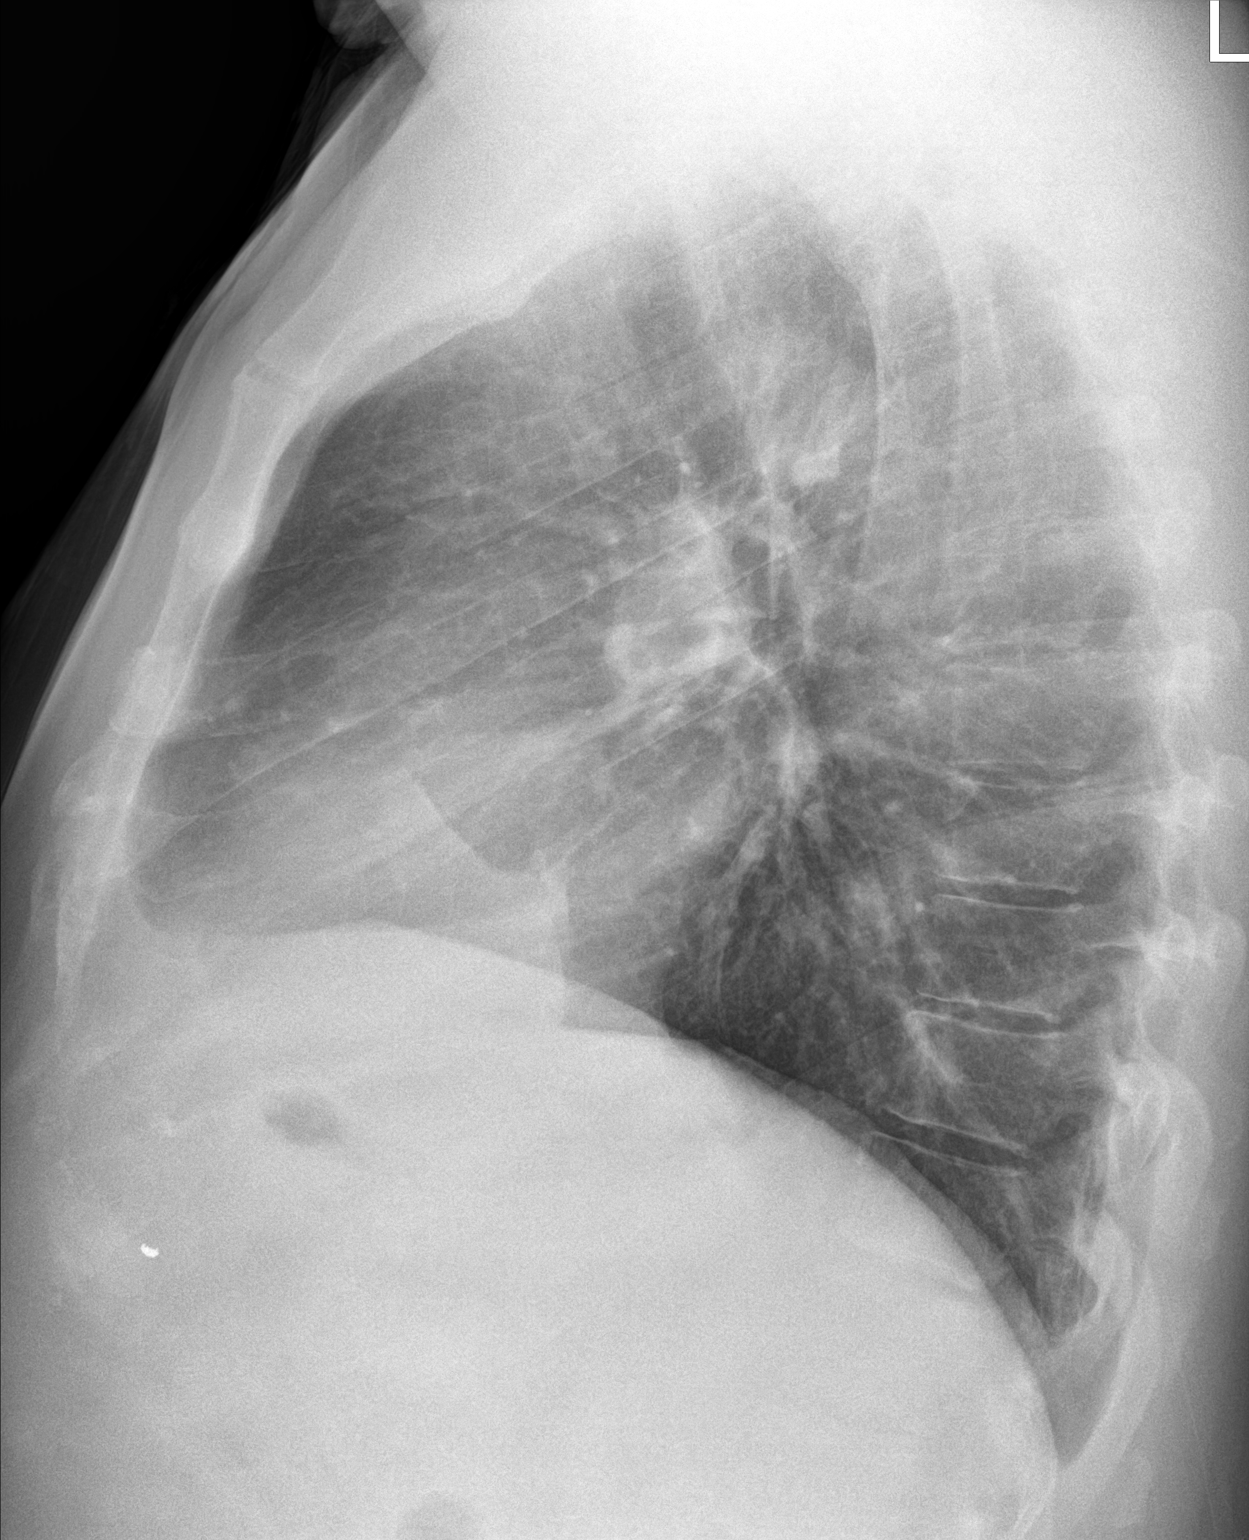

[2 of 2 positions shown; findings below may reference images not displayed]

FINDINGS: Upper normal heart size.

Mediastinal contours and pulmonary vascularity normal.

Lungs clear.

No pulmonary infiltrate, pleural effusion or pneumothorax.

Osseous structures unremarkable.

Metallic foreign body projects over LEFT upper quadrant, unchanged.
IMPRESSION: No acute abnormalities.

## 2022-03-18 ENCOUNTER — Ambulatory Visit: Payer: No Typology Code available for payment source | Admitting: Physician Assistant

## 2022-03-18 DIAGNOSIS — Z85828 Personal history of other malignant neoplasm of skin: Secondary | ICD-10-CM

## 2022-03-18 DIAGNOSIS — Z1283 Encounter for screening for malignant neoplasm of skin: Secondary | ICD-10-CM

## 2022-03-18 DIAGNOSIS — B079 Viral wart, unspecified: Secondary | ICD-10-CM | POA: Diagnosis not present

## 2022-03-18 DIAGNOSIS — L570 Actinic keratosis: Secondary | ICD-10-CM

## 2022-03-20 ENCOUNTER — Encounter: Payer: Self-pay | Admitting: Physician Assistant

## 2022-03-20 NOTE — Progress Notes (Signed)
? ?  Follow-Up Visit ?  ?Subjective  ?Steve Taylor is a 51 y.o. male who presents for the following: Annual Exam (Here for annual skin exam. No concerns but patient has some tags on his left eye. History of basal cell carcinoma. ). ? ? ?The following portions of the chart were reviewed this encounter and updated as appropriate:  Tobacco  Allergies  Meds  Problems  Med Hx  Surg Hx  Fam Hx   ?  ? ?Objective  ?Well appearing patient in no apparent distress; mood and affect are within normal limits. ? ?All skin waist up examined. ? ?No atypical nevi or signs of NMSC noted at the time of the visit.  ? ?Left Upper Eyelid ?Verrucous papules -- Discussed viral etiology and contagion.  ? ?Left Superior Helix ?Erythematous patches with gritty scale. ? ? ?Assessment & Plan  ?Encounter for screening for malignant neoplasm of skin ? ?Yearly skin exams ? ?Viral warts, unspecified type ?Left Upper Eyelid ? ?Destruction of lesion - Left Upper Eyelid ?Complexity: simple   ?Destruction method comment:  Scissors were used to snip tag at the base ?Informed consent: discussed and consent obtained   ?Timeout:  patient name, date of birth, surgical site, and procedure verified ?Anesthesia: the lesion was anesthetized in a standard fashion   ?Hemostasis achieved with:  pressure ?Outcome: patient tolerated procedure well with no complications   ?Post-procedure details: wound care instructions given   ? ?AK (actinic keratosis) ?Left Superior Helix ? ?Destruction of lesion - Left Superior Helix ?Complexity: simple   ?Destruction method: cryotherapy   ?Informed consent: discussed and consent obtained   ?Timeout:  patient name, date of birth, surgical site, and procedure verified ?Lesion destroyed using liquid nitrogen: Yes   ?Cryotherapy cycles:  3 ?Outcome: patient tolerated procedure well with no complications   ? ? ? ?I, Marco Raper, PA-C, have reviewed all documentation's for this visit.  The documentation on 03/20/22 for the  exam, diagnosis, procedures and orders are all accurate and complete. ?

## 2022-04-08 ENCOUNTER — Other Ambulatory Visit (HOSPITAL_COMMUNITY): Payer: Self-pay

## 2022-07-07 ENCOUNTER — Other Ambulatory Visit (HOSPITAL_COMMUNITY): Payer: Self-pay

## 2022-07-08 ENCOUNTER — Other Ambulatory Visit (HOSPITAL_COMMUNITY): Payer: Self-pay

## 2022-10-30 ENCOUNTER — Other Ambulatory Visit: Payer: Self-pay

## 2023-02-04 ENCOUNTER — Encounter: Payer: Self-pay | Admitting: Family Medicine

## 2023-02-04 ENCOUNTER — Ambulatory Visit: Payer: 59 | Admitting: Family Medicine

## 2023-02-04 ENCOUNTER — Other Ambulatory Visit: Payer: Self-pay

## 2023-02-04 VITALS — BP 132/84 | HR 79 | Temp 97.0°F | Ht 75.0 in | Wt 259.0 lb

## 2023-02-04 DIAGNOSIS — Z905 Acquired absence of kidney: Secondary | ICD-10-CM | POA: Diagnosis not present

## 2023-02-04 DIAGNOSIS — K219 Gastro-esophageal reflux disease without esophagitis: Secondary | ICD-10-CM | POA: Diagnosis not present

## 2023-02-04 DIAGNOSIS — Z1212 Encounter for screening for malignant neoplasm of rectum: Secondary | ICD-10-CM

## 2023-02-04 DIAGNOSIS — Z1211 Encounter for screening for malignant neoplasm of colon: Secondary | ICD-10-CM | POA: Diagnosis not present

## 2023-02-04 DIAGNOSIS — Z0001 Encounter for general adult medical examination with abnormal findings: Secondary | ICD-10-CM | POA: Diagnosis not present

## 2023-02-04 DIAGNOSIS — I10 Essential (primary) hypertension: Secondary | ICD-10-CM | POA: Insufficient documentation

## 2023-02-04 DIAGNOSIS — K529 Noninfective gastroenteritis and colitis, unspecified: Secondary | ICD-10-CM

## 2023-02-04 DIAGNOSIS — Z0000 Encounter for general adult medical examination without abnormal findings: Secondary | ICD-10-CM

## 2023-02-04 DIAGNOSIS — E039 Hypothyroidism, unspecified: Secondary | ICD-10-CM | POA: Diagnosis not present

## 2023-02-04 MED ORDER — LEVOTHYROXINE SODIUM 100 MCG PO TABS
100.0000 ug | ORAL_TABLET | Freq: Every day | ORAL | 1 refills | Status: DC
  Filled 2023-02-04: qty 90, 90d supply, fill #0

## 2023-02-04 MED ORDER — PANTOPRAZOLE SODIUM 40 MG PO TBEC
40.0000 mg | DELAYED_RELEASE_TABLET | Freq: Every day | ORAL | 1 refills | Status: DC
  Filled 2023-02-04: qty 90, 90d supply, fill #0
  Filled 2023-04-26 – 2023-05-11 (×2): qty 90, 90d supply, fill #1

## 2023-02-04 MED ORDER — METOPROLOL SUCCINATE ER 25 MG PO TB24
25.0000 mg | ORAL_TABLET | Freq: Every day | ORAL | 1 refills | Status: DC
  Filled 2023-02-04: qty 90, 90d supply, fill #0
  Filled 2023-04-26 – 2023-05-11 (×2): qty 90, 90d supply, fill #1
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
## 2023-02-06 ENCOUNTER — Other Ambulatory Visit: Payer: 59

## 2023-02-06 DIAGNOSIS — I10 Essential (primary) hypertension: Secondary | ICD-10-CM

## 2023-02-06 DIAGNOSIS — Z0000 Encounter for general adult medical examination without abnormal findings: Secondary | ICD-10-CM | POA: Diagnosis not present

## 2023-02-06 DIAGNOSIS — K219 Gastro-esophageal reflux disease without esophagitis: Secondary | ICD-10-CM

## 2023-02-06 DIAGNOSIS — K529 Noninfective gastroenteritis and colitis, unspecified: Secondary | ICD-10-CM

## 2023-02-06 DIAGNOSIS — E039 Hypothyroidism, unspecified: Secondary | ICD-10-CM | POA: Diagnosis not present

## 2023-02-06 DIAGNOSIS — Z905 Acquired absence of kidney: Secondary | ICD-10-CM | POA: Diagnosis not present

## 2023-02-06 LAB — LIPID PANEL

## 2023-02-07 LAB — CBC WITH DIFFERENTIAL/PLATELET
Basophils Absolute: 0.2 10*3/uL (ref 0.0–0.2)
Basos: 2 %
EOS (ABSOLUTE): 0.4 10*3/uL (ref 0.0–0.4)
Eos: 3 %
Hematocrit: 46.7 % (ref 37.5–51.0)
Hemoglobin: 16.1 g/dL (ref 13.0–17.7)
Immature Grans (Abs): 0 10*3/uL (ref 0.0–0.1)
Immature Granulocytes: 0 %
Lymphocytes Absolute: 3.6 10*3/uL — ABNORMAL HIGH (ref 0.7–3.1)
Lymphs: 36 %
MCH: 30.6 pg (ref 26.6–33.0)
MCHC: 34.5 g/dL (ref 31.5–35.7)
MCV: 89 fL (ref 79–97)
Monocytes Absolute: 0.7 10*3/uL (ref 0.1–0.9)
Monocytes: 7 %
Neutrophils Absolute: 5.4 10*3/uL (ref 1.4–7.0)
Neutrophils: 52 %
Platelets: 376 10*3/uL (ref 150–450)
RBC: 5.26 x10E6/uL (ref 4.14–5.80)
RDW: 14.1 % (ref 11.6–15.4)
WBC: 10.2 10*3/uL (ref 3.4–10.8)

## 2023-02-07 LAB — LIPID PANEL
Chol/HDL Ratio: 5 ratio (ref 0.0–5.0)
Cholesterol, Total: 205 mg/dL — ABNORMAL HIGH (ref 100–199)
HDL: 41 mg/dL (ref >39)
LDL Chol Calc (NIH): 134 mg/dL — ABNORMAL HIGH (ref 0–99)
Triglycerides: 169 mg/dL — ABNORMAL HIGH (ref 0–149)
VLDL Cholesterol Cal: 30 mg/dL (ref 5–40)

## 2023-02-07 LAB — CMP14+EGFR
ALT: 40 IU/L (ref 0–44)
AST: 26 IU/L (ref 0–40)
Albumin/Globulin Ratio: 1.8 (ref 1.2–2.2)
Albumin: 4.4 g/dL (ref 4.1–5.1)
Alkaline Phosphatase: 88 IU/L (ref 44–121)
BUN/Creatinine Ratio: 12 (ref 9–20)
BUN: 13 mg/dL (ref 6–24)
Bilirubin Total: 0.7 mg/dL (ref 0.0–1.2)
CO2: 20 mmol/L (ref 20–29)
Calcium: 9.8 mg/dL (ref 8.7–10.2)
Chloride: 103 mmol/L (ref 96–106)
Creatinine, Ser: 1.11 mg/dL (ref 0.76–1.27)
Globulin, Total: 2.5 g/dL (ref 1.5–4.5)
Glucose: 116 mg/dL — ABNORMAL HIGH (ref 70–99)
Potassium: 4.1 mmol/L (ref 3.5–5.2)
Sodium: 140 mmol/L (ref 134–144)
Total Protein: 6.9 g/dL (ref 6.0–8.5)
eGFR: 81 mL/min/{1.73_m2} (ref >59)

## 2023-02-07 LAB — THYROID PANEL WITH TSH
Free Thyroxine Index: 2.3 (ref 1.2–4.9)
T3 Uptake Ratio: 27 % (ref 24–39)
T4, Total: 8.7 ug/dL (ref 4.5–12.0)
TSH: 4.82 u[IU]/mL — ABNORMAL HIGH (ref 0.450–4.500)

## 2023-02-07 LAB — CELIAC DISEASE PANEL
Endomysial IgA: NEGATIVE
IgA/Immunoglobulin A, Serum: 256 mg/dL (ref 90–386)
Transglutaminase IgA: <2 U/mL (ref 0–3)

## 2023-02-10 ENCOUNTER — Other Ambulatory Visit (HOSPITAL_COMMUNITY): Payer: Self-pay

## 2023-02-10 MED ORDER — LEVOTHYROXINE SODIUM 112 MCG PO TABS
112.0000 ug | ORAL_TABLET | Freq: Every day | ORAL | 3 refills | Status: DC
  Filled 2023-02-10 – 2023-02-11 (×2): qty 90, 90d supply, fill #0
  Filled 2023-04-26 – 2023-05-11 (×2): qty 90, 90d supply, fill #1
  Filled 2023-08-17: qty 90, 90d supply, fill #2
  Filled 2023-11-24: qty 90, 90d supply, fill #3

## 2023-02-11 ENCOUNTER — Other Ambulatory Visit (HOSPITAL_COMMUNITY): Payer: Self-pay

## 2023-02-12 ENCOUNTER — Other Ambulatory Visit: Payer: Self-pay

## 2023-02-18 ENCOUNTER — Ambulatory Visit: Payer: Self-pay | Admitting: Family Medicine

## 2023-04-01 ENCOUNTER — Encounter: Payer: Self-pay | Admitting: Gastroenterology

## 2023-04-01 ENCOUNTER — Other Ambulatory Visit (HOSPITAL_COMMUNITY): Payer: Self-pay

## 2023-04-01 ENCOUNTER — Ambulatory Visit (AMBULATORY_SURGERY_CENTER): Payer: 59 | Admitting: *Deleted

## 2023-04-01 VITALS — Ht 75.0 in | Wt 260.0 lb

## 2023-04-01 DIAGNOSIS — Z1211 Encounter for screening for malignant neoplasm of colon: Secondary | ICD-10-CM

## 2023-04-01 MED ORDER — NA SULFATE-K SULFATE-MG SULF 17.5-3.13-1.6 GM/177ML PO SOLN
1.0000 | Freq: Once | ORAL | 0 refills | Status: AC
Start: 2023-04-01 — End: 2023-04-07
  Filled 2023-04-01: qty 354, 2d supply, fill #0
  Filled 2023-04-03: qty 354, 1d supply, fill #0

## 2023-04-01 NOTE — Progress Notes (Signed)

## 2023-04-03 ENCOUNTER — Other Ambulatory Visit (HOSPITAL_COMMUNITY): Payer: Self-pay

## 2023-04-06 ENCOUNTER — Other Ambulatory Visit: Payer: Self-pay

## 2023-04-06 ENCOUNTER — Other Ambulatory Visit (HOSPITAL_COMMUNITY): Payer: Self-pay

## 2023-04-24 ENCOUNTER — Encounter: Payer: Self-pay | Admitting: Gastroenterology

## 2023-04-24 ENCOUNTER — Ambulatory Visit: Payer: 59 | Admitting: Gastroenterology

## 2023-04-24 VITALS — BP 148/92 | HR 57 | Temp 98.4°F | Resp 13 | Ht 75.0 in | Wt 260.0 lb

## 2023-04-24 DIAGNOSIS — Z1211 Encounter for screening for malignant neoplasm of colon: Secondary | ICD-10-CM

## 2023-04-24 DIAGNOSIS — K529 Noninfective gastroenteritis and colitis, unspecified: Secondary | ICD-10-CM | POA: Diagnosis not present

## 2023-04-24 DIAGNOSIS — E039 Hypothyroidism, unspecified: Secondary | ICD-10-CM | POA: Diagnosis not present

## 2023-04-24 DIAGNOSIS — D123 Benign neoplasm of transverse colon: Secondary | ICD-10-CM | POA: Diagnosis not present

## 2023-04-24 MED ORDER — SODIUM CHLORIDE 0.9 % IV SOLN
500.0000 mL | Freq: Once | INTRAVENOUS | Status: DC
Start: 2023-04-24 — End: 2023-04-24

## 2023-04-24 NOTE — Op Note (Signed)
[facility] Patient Name: Steve Taylor Procedure Date: 04/24/2023 8:06 AM MRN: [id number] Endoscopist: Viviann Spare P. Adela Lank , MD, [id number] Age: 52 Referring MD:  Date of Birth: Jul 03, 1971 Gender: Male Account #: [id number] Procedure:                Colonoscopy Indications:              Screening for colorectal malignant neoplasm, This                            is the patient's first colonoscopy. Incidentally                            the patient endorses chronic loose stools Medicines:                Monitored Anesthesia Care Procedure:                Pre-Anesthesia Assessment:                           - Prior to the procedure, a History and Physical                            was performed, and patient medications and                            allergies were reviewed. The patient's tolerance of                            previous anesthesia was also reviewed. The risks                            and benefits of the procedure and the sedation                            options and risks were discussed with the patient.                            All questions were answered, and informed consent                            was obtained. Prior Anticoagulants: The patient has                            taken no anticoagulant or antiplatelet agents. ASA                            Grade Assessment: II - A patient with mild systemic                            disease. After reviewing the risks and benefits,                            the patient was deemed in satisfactory condition to  undergo the procedure.                           After obtaining informed consent, the colonoscope                            was passed under direct vision. Throughout the                            procedure, the patient's blood pressure, pulse, and                            oxygen saturations were monitored continuously. The                            Olympus  CF-HQ190L SN [id number] was introduced through                            the anus and advanced to the the terminal ileum,                            with identification of the appendiceal orifice and                            IC valve. The colonoscopy was performed without                            difficulty. The patient tolerated the procedure                            well. The quality of the bowel preparation was                            good. The ileocecal valve, appendiceal orifice, and                            rectum were photographed. Scope In: 8:26:53 AM Scope Out: 8:42:48 AM Scope Withdrawal Time: 0 hours 14 minutes 20 seconds  Total Procedure Duration: 0 hours 15 minutes 55 seconds  Findings:                 The perianal and digital rectal examinations were                            normal.                           The terminal ileum appeared normal.                           A 3 to 4 mm polyp was found in the hepatic flexure.                            The polyp was sessile. The polyp was removed with a  cold snare. Resection and retrieval were complete.                           A 3 to 4 mm polyp was found in the transverse                            colon. The polyp was sessile. The polyp was removed                            with a cold snare. Resection and retrieval were                            complete.                           Anal papilla(e) were hypertrophied.                           Internal hemorrhoids were found during retroflexion.                           The exam was otherwise without abnormality.                           Biopsies for histology were taken with a cold                            forceps from the right colon, left colon and                            transverse colon for evaluation of microscopic                            colitis. Complications:            No immediate complications. Estimated blood loss:                             Minimal. Estimated Blood Loss:     Estimated blood loss was minimal. Impression:               - The examined portion of the ileum was normal.                           - One 3 to 4 mm polyp at the hepatic flexure,                            removed with a cold snare. Resected and retrieved.                           - One 3 to 4 mm polyp in the transverse colon,                            removed with a cold snare. Resected and retrieved.                           -  Anal papilla(e) were hypertrophied.                           - Internal hemorrhoids.                           - The examination was otherwise normal.                           - Biopsies were taken with a cold forceps from the                            right colon, left colon and transverse colon for                            evaluation of microscopic colitis.                           - The GI Genius (intelligent endoscopy module),                            computer-aided polyp detection system powered by AI                            was utilized to detect colorectal polyps through                            enhanced visualization during colonoscopy. Recommendation:           - Patient has a contact number available for                            emergencies. The signs and symptoms of potential                            delayed complications were discussed with the                            patient. Return to normal activities tomorrow.                            Written discharge instructions were provided to the                            patient.                           - Resume previous diet.                           - Continue present medications.                           - Await pathology results with further                            recommendations  on chronic loose stools. Viviann Spare P. Tonna Palazzi, MD 04/24/2023 8:48:56 AM This report has been signed electronically.

## 2023-04-24 NOTE — Patient Instructions (Signed)
YOU HAD AN ENDOSCOPIC PROCEDURE TODAY AT THE [facility]:   Refer to the procedure report that was given to you for any specific questions about what was found during the examination.  If the procedure report does not answer your questions, please call your gastroenterologist to clarify.  If you requested that your care partner not be given the details of your procedure findings, then the procedure report has been included in a sealed envelope for you to review at your convenience later.  **Handouts given on polyps and Hemorrhoids**  YOU SHOULD EXPECT: Some feelings of bloating in the abdomen. Passage of more gas than usual.  Walking can help get rid of the air that was put into your GI tract during the procedure and reduce the bloating. If you had a lower endoscopy (such as a colonoscopy or flexible sigmoidoscopy) you may notice spotting of blood in your stool or on the toilet paper. If you underwent a bowel prep for your procedure, you may not have a normal bowel movement for a few days.  Please Note:  You might notice some irritation and congestion in your nose or some drainage.  This is from the oxygen used during your procedure.  There is no need for concern and it should clear up in a day or so.  SYMPTOMS TO REPORT IMMEDIATELY:  Following lower endoscopy (colonoscopy or flexible sigmoidoscopy):  Excessive amounts of blood in the stool  Significant tenderness or worsening of abdominal pains  Swelling of the abdomen that is new, acute  Fever of 100F or higher   For urgent or emergent issues, a gastroenterologist can be reached at any hour by calling [phone]. Do not use MyChart messaging for urgent concerns.    DIET:  We do recommend a small meal at first, but then you may proceed to your regular diet.  Drink plenty of fluids but you should avoid alcoholic beverages for 24 hours.  ACTIVITY:  You should plan to take it easy for the rest of today and you should NOT DRIVE  or use heavy machinery until tomorrow (because of the sedation medicines used during the test).    FOLLOW UP: Our staff will call the number listed on your records the next business day following your procedure.  We will call around 7:15- 8:00 am to check on you and address any questions or concerns that you may have regarding the information given to you following your procedure. If we do not reach you, we will leave a message.     If any biopsies were taken you will be contacted by phone or by letter within the next 1-3 weeks.  Please call us at [phone] if you have not heard about the biopsies in 3 weeks.    SIGNATURES/CONFIDENTIALITY: You and/or your care partner have signed paperwork which will be entered into your electronic medical record.  These signatures attest to the fact that that the information above on your After Visit Summary has been reviewed and is understood.  Full responsibility of the confidentiality of this discharge information lies with you and/or your care-partner.  

## 2023-04-24 NOTE — Progress Notes (Signed)
A and O x3. Report to RN. Tolerated MAC anesthesia well. 
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
## 2023-04-24 NOTE — Progress Notes (Signed)
Called to room to assist during endoscopic procedure.  Patient ID and intended procedure confirmed with present staff. Received instructions for my participation in the procedure from the performing physician.  

## 2023-04-24 NOTE — Progress Notes (Signed)
VS by CW  Pt's states no medical or surgical changes since previsit or office visit.  

## 2023-04-27 ENCOUNTER — Telehealth: Payer: Self-pay | Admitting: *Deleted

## 2023-04-27 ENCOUNTER — Other Ambulatory Visit: Payer: Self-pay

## 2023-04-27 NOTE — Telephone Encounter (Signed)
  Follow up Call-     04/24/2023    7:55 AM  Call back number  Post procedure Call Back phone  # [phone]  Permission to leave phone message Yes   [facility]

## 2023-05-05 NOTE — Telephone Encounter (Signed)
Letter mailed to patient re: appointment in Sept

## 2023-05-11 ENCOUNTER — Other Ambulatory Visit (HOSPITAL_COMMUNITY): Payer: Self-pay

## 2023-05-14 ENCOUNTER — Other Ambulatory Visit: Payer: Self-pay

## 2023-05-25 DIAGNOSIS — L814 Other melanin hyperpigmentation: Secondary | ICD-10-CM | POA: Diagnosis not present

## 2023-05-25 DIAGNOSIS — L720 Epidermal cyst: Secondary | ICD-10-CM | POA: Diagnosis not present

## 2023-05-25 DIAGNOSIS — L918 Other hypertrophic disorders of the skin: Secondary | ICD-10-CM | POA: Diagnosis not present

## 2023-05-25 DIAGNOSIS — Z85828 Personal history of other malignant neoplasm of skin: Secondary | ICD-10-CM | POA: Diagnosis not present

## 2023-05-25 DIAGNOSIS — D1801 Hemangioma of skin and subcutaneous tissue: Secondary | ICD-10-CM | POA: Diagnosis not present

## 2023-05-25 DIAGNOSIS — D2262 Melanocytic nevi of left upper limb, including shoulder: Secondary | ICD-10-CM | POA: Diagnosis not present

## 2023-05-25 DIAGNOSIS — D225 Melanocytic nevi of trunk: Secondary | ICD-10-CM | POA: Diagnosis not present

## 2023-05-25 DIAGNOSIS — D2261 Melanocytic nevi of right upper limb, including shoulder: Secondary | ICD-10-CM | POA: Diagnosis not present

## 2023-05-25 DIAGNOSIS — D2271 Melanocytic nevi of right lower limb, including hip: Secondary | ICD-10-CM | POA: Diagnosis not present

## 2023-05-25 DIAGNOSIS — L821 Other seborrheic keratosis: Secondary | ICD-10-CM | POA: Diagnosis not present

## 2023-06-02 ENCOUNTER — Encounter: Payer: Self-pay | Admitting: Cardiology

## 2023-06-02 ENCOUNTER — Ambulatory Visit: Payer: 59 | Attending: Cardiology | Admitting: Cardiology

## 2023-06-02 VITALS — BP 138/88 | HR 83 | Ht 75.0 in | Wt 262.4 lb

## 2023-06-02 DIAGNOSIS — R002 Palpitations: Secondary | ICD-10-CM

## 2023-06-02 DIAGNOSIS — R079 Chest pain, unspecified: Secondary | ICD-10-CM

## 2023-06-02 NOTE — Patient Instructions (Addendum)
Medication Instructions:   Continue all current medications.   Labwork:  none  Testing/Procedures:  Your physician has requested that you have an echocardiogram. Echocardiography is a painless test that uses sound waves to create images of your heart. It provides your doctor with information about the size and shape of your heart and how well your heart's chambers and valves are working. This procedure takes approximately one hour. There are no restrictions for this procedure. Please do NOT wear cologne, perfume, aftershave, or lotions (deodorant is allowed). Please arrive 15 minutes prior to your appointment time. Office will contact with results via phone, letter or mychart.     Follow-Up:  Pending   Any Other Special Instructions Will Be Listed Below (If Applicable).  DASH diet info given   If you need a refill on your cardiac medications before your next appointment, please call your pharmacy.
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
## 2023-06-15 ENCOUNTER — Ambulatory Visit: Payer: 59 | Attending: Cardiology

## 2023-06-15 DIAGNOSIS — R079 Chest pain, unspecified: Secondary | ICD-10-CM

## 2023-06-15 LAB — ECHOCARDIOGRAM COMPLETE
AR max vel: 3.82 cm2
AV Area VTI: 3.71 cm2
AV Area mean vel: 3.92 cm2
AV Mean grad: 3 mmHg
AV Peak grad: 5.1 mmHg
Ao pk vel: 1.13 m/s
Area-P 1/2: 3.85 cm2
Calc EF: 64.6 %
MV VTI: 3.2 cm2
S' Lateral: 3.4 cm
Single Plane A2C EF: 66.7 %
Single Plane A4C EF: 61.5 %

## 2023-07-02 ENCOUNTER — Telehealth: Payer: Self-pay | Admitting: Cardiology

## 2023-07-02 ENCOUNTER — Encounter: Payer: Self-pay | Admitting: *Deleted

## 2023-07-02 NOTE — Telephone Encounter (Signed)
Wife called to follow-up on patient's test results. 

## 2023-07-02 NOTE — Telephone Encounter (Signed)
Will notify via mychart as well.

## 2023-08-14 ENCOUNTER — Ambulatory Visit: Payer: 59 | Admitting: Gastroenterology
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
## 2023-08-17 ENCOUNTER — Other Ambulatory Visit: Payer: Self-pay | Admitting: Family Medicine

## 2023-08-17 ENCOUNTER — Other Ambulatory Visit (HOSPITAL_COMMUNITY): Payer: Self-pay

## 2023-08-17 DIAGNOSIS — K219 Gastro-esophageal reflux disease without esophagitis: Secondary | ICD-10-CM

## 2023-08-17 DIAGNOSIS — I10 Essential (primary) hypertension: Secondary | ICD-10-CM

## 2023-08-17 MED ORDER — METOPROLOL SUCCINATE ER 25 MG PO TB24
25.0000 mg | ORAL_TABLET | Freq: Every day | ORAL | 1 refills | Status: DC
Start: 2023-08-17 — End: 2024-03-07
  Filled 2023-08-17: qty 90, 90d supply, fill #0
  Filled 2023-11-24: qty 90, 90d supply, fill #1

## 2023-08-17 MED ORDER — PANTOPRAZOLE SODIUM 40 MG PO TBEC
40.0000 mg | DELAYED_RELEASE_TABLET | Freq: Every day | ORAL | 1 refills | Status: DC
Start: 2023-08-17 — End: 2024-03-07
  Filled 2023-08-17: qty 90, 90d supply, fill #0
  Filled 2023-11-24: qty 90, 90d supply, fill #1

## 2023-08-18 ENCOUNTER — Other Ambulatory Visit (HOSPITAL_COMMUNITY): Payer: Self-pay

## 2023-08-18 ENCOUNTER — Ambulatory Visit: Payer: 59 | Admitting: Family Medicine

## 2023-08-18 ENCOUNTER — Encounter: Payer: Self-pay | Admitting: Family Medicine

## 2023-08-18 VITALS — BP 143/99 | HR 66 | Temp 97.6°F | Ht 75.0 in | Wt 265.2 lb

## 2023-08-18 DIAGNOSIS — J309 Allergic rhinitis, unspecified: Secondary | ICD-10-CM | POA: Diagnosis not present

## 2023-08-18 DIAGNOSIS — H66011 Acute suppurative otitis media with spontaneous rupture of ear drum, right ear: Secondary | ICD-10-CM | POA: Diagnosis not present

## 2023-08-18 DIAGNOSIS — H1013 Acute atopic conjunctivitis, bilateral: Secondary | ICD-10-CM

## 2023-08-18 DIAGNOSIS — J069 Acute upper respiratory infection, unspecified: Secondary | ICD-10-CM

## 2023-08-18 MED ORDER — AMOXICILLIN-POT CLAVULANATE 875-125 MG PO TABS
1.0000 | ORAL_TABLET | Freq: Two times a day (BID) | ORAL | 0 refills | Status: AC
Start: 2023-08-18 — End: 2023-08-28

## 2023-08-18 MED ORDER — LEVOCETIRIZINE DIHYDROCHLORIDE 5 MG PO TABS
5.0000 mg | ORAL_TABLET | Freq: Every evening | ORAL | 1 refills | Status: DC
Start: 2023-08-18 — End: 2024-03-01
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
## 2023-11-12 DIAGNOSIS — H5203 Hypermetropia, bilateral: Secondary | ICD-10-CM | POA: Diagnosis not present

## 2023-11-25 ENCOUNTER — Other Ambulatory Visit (HOSPITAL_COMMUNITY): Payer: Self-pay

## 2023-12-01 ENCOUNTER — Other Ambulatory Visit (HOSPITAL_COMMUNITY): Payer: Self-pay

## 2024-03-01 ENCOUNTER — Telehealth: Payer: Self-pay | Admitting: Family Medicine

## 2024-03-01 DIAGNOSIS — H1013 Acute atopic conjunctivitis, bilateral: Secondary | ICD-10-CM

## 2024-03-01 MED ORDER — LEVOCETIRIZINE DIHYDROCHLORIDE 5 MG PO TABS: 5.0000 mg | ORAL_TABLET | Freq: Every evening | ORAL | 0 refills | Status: DC

## 2024-03-01 NOTE — Telephone Encounter (Signed)
 NA/VM full, refill was sent to pharmacy, pt does need to make appt for yearly PE, last PE was 02/04/23

## 2024-03-01 NOTE — Telephone Encounter (Signed)
  Prescription Request  03/01/2024   What is the name of the medication or equipment? Xyzal  Have you contacted your pharmacy to request a refill? Yes  Which pharmacy would you like this sent to? [facility]   Patient notified that their request is being sent to the clinical staff for review and that they should receive a response within 2 business days.

## 2024-03-07 ENCOUNTER — Other Ambulatory Visit: Payer: Self-pay | Admitting: Family Medicine

## 2024-03-07 ENCOUNTER — Other Ambulatory Visit (HOSPITAL_COMMUNITY): Payer: Self-pay

## 2024-03-07 DIAGNOSIS — I10 Essential (primary) hypertension: Secondary | ICD-10-CM

## 2024-03-07 DIAGNOSIS — K219 Gastro-esophageal reflux disease without esophagitis: Secondary | ICD-10-CM

## 2024-03-07 DIAGNOSIS — E039 Hypothyroidism, unspecified: Secondary | ICD-10-CM

## 2024-03-07 MED ORDER — LEVOTHYROXINE SODIUM 112 MCG PO TABS
112.0000 ug | ORAL_TABLET | Freq: Every day | ORAL | 3 refills | Status: DC
  Filled 2024-03-07: qty 90, 90d supply, fill #0
  Filled 2024-06-05: qty 90, 90d supply, fill #1
  Filled 2024-09-05: qty 90, 90d supply, fill #2

## 2024-03-07 MED ORDER — PANTOPRAZOLE SODIUM 40 MG PO TBEC
40.0000 mg | DELAYED_RELEASE_TABLET | Freq: Every day | ORAL | 1 refills | Status: DC
  Filled 2024-03-07: qty 90, 90d supply, fill #0
  Filled 2024-06-05: qty 90, 90d supply, fill #1

## 2024-03-07 MED ORDER — METOPROLOL SUCCINATE ER 25 MG PO TB24
25.0000 mg | ORAL_TABLET | Freq: Every day | ORAL | 1 refills | Status: DC
  Filled 2024-03-07: qty 90, 90d supply, fill #0
  Filled 2024-06-05: qty 90, 90d supply, fill #1

## 2024-03-08 ENCOUNTER — Other Ambulatory Visit (HOSPITAL_COMMUNITY): Payer: Self-pay

## 2024-03-11 ENCOUNTER — Ambulatory Visit (INDEPENDENT_AMBULATORY_CARE_PROVIDER_SITE_OTHER): Admitting: Nurse Practitioner

## 2024-03-11 ENCOUNTER — Encounter: Payer: Self-pay | Admitting: Nurse Practitioner

## 2024-03-11 VITALS — BP 144/99 | HR 56 | Temp 97.5°F | Ht 75.0 in | Wt 264.0 lb

## 2024-03-11 DIAGNOSIS — E039 Hypothyroidism, unspecified: Secondary | ICD-10-CM

## 2024-03-11 DIAGNOSIS — Z0001 Encounter for general adult medical examination with abnormal findings: Secondary | ICD-10-CM | POA: Diagnosis not present

## 2024-03-11 DIAGNOSIS — R351 Nocturia: Secondary | ICD-10-CM | POA: Diagnosis not present

## 2024-03-11 DIAGNOSIS — Z0000 Encounter for general adult medical examination without abnormal findings: Secondary | ICD-10-CM

## 2024-03-11 DIAGNOSIS — K219 Gastro-esophageal reflux disease without esophagitis: Secondary | ICD-10-CM | POA: Diagnosis not present

## 2024-03-11 DIAGNOSIS — R5383 Other fatigue: Secondary | ICD-10-CM | POA: Diagnosis not present

## 2024-03-11 DIAGNOSIS — I10 Essential (primary) hypertension: Secondary | ICD-10-CM

## 2024-03-11 NOTE — Patient Instructions (Signed)
 Fatigue If you have fatigue, you feel tired all the time and have a lack of energy or a lack of motivation. Fatigue may make it difficult to start or complete tasks because of exhaustion. Occasional or mild fatigue is often a normal response to activity or life. However, long-term (chronic) or extreme fatigue may be a symptom of a medical condition such as: Depression. Not having enough red blood cells or hemoglobin in the blood (anemia). A problem with a small gland located in the lower front part of the neck (thyroid disorder). Rheumatologic conditions. These are problems related to the body's defense system (immune system). Infections, especially certain viral infections. Fatigue can also lead to negative health outcomes over time. Follow these instructions at home: Medicines Take over-the-counter and prescription medicines only as told by your health care provider. Take a multivitamin if told by your health care provider. Do not use herbal or dietary supplements unless they are approved by your health care provider. Eating and drinking  Avoid heavy meals in the evening. Eat a well-balanced diet, which includes lean proteins, whole grains, plenty of fruits and vegetables, and low-fat dairy products. Avoid eating or drinking too many products with caffeine in them. Avoid alcohol. Drink enough fluid to keep your urine pale yellow. Activity  Exercise regularly, as told by your health care provider. Use or practice techniques to help you relax, such as yoga, tai chi, meditation, or massage therapy. Lifestyle Change situations that cause you stress. Try to keep your work and personal schedules in balance. Do not use recreational or illegal drugs. General instructions Monitor your fatigue for any changes. Go to bed and get up at the same time every day. Avoid fatigue by pacing yourself during the day and getting enough sleep at night. Maintain a healthy weight. Contact a health care  provider if: Your fatigue does not get better. You have a fever. You suddenly lose or gain weight. You have headaches. You have trouble falling asleep or sleeping through the night. You feel angry, guilty, anxious, or sad. You have swelling in your legs or another part of your body. Get help right away if: You feel confused, feel like you might faint, or faint. Your vision is blurry or you have a severe headache. You have severe pain in your abdomen, your back, or the area between your waist and hips (pelvis). You have chest pain, shortness of breath, or an irregular or fast heartbeat. You are unable to urinate, or you urinate less than normal. You have abnormal bleeding from the rectum, nose, lungs, nipples, or, if you are male, the vagina. You vomit blood. You have thoughts about hurting yourself or others. These symptoms may be an emergency. Get help right away. Call 911. Do not wait to see if the symptoms will go away. Do not drive yourself to the hospital. Get help right away if you feel like you may hurt yourself or others, or have thoughts about taking your own life. Go to your nearest emergency room or: Call 911. Call the National Suicide Prevention Lifeline at (262)721-8699 or 988. This is open 24 hours a day. Text the Crisis Text Line at 8450584327. Summary If you have fatigue, you feel tired all the time and have a lack of energy or a lack of motivation. Fatigue may make it difficult to start or complete tasks because of exhaustion. Long-term (chronic) or extreme fatigue may be a symptom of a medical condition. Exercise regularly, as told by your health care provider.  Change situations that cause you stress. Try to keep your work and personal schedules in balance. This information is not intended to replace advice given to you by your health care provider. Make sure you discuss any questions you have with your health care provider. Document Revised: 08/26/2021 Document  Reviewed: 08/26/2021 Elsevier Patient Education  2024 [employer].

## 2024-03-11 NOTE — Progress Notes (Signed)
 Subjective:    Patient ID: Steve Taylor, male    DOB: Jul 18, 1971, 53 y.o.   MRN: [id number]   Chief Complaint: medical management of chronic issues     HPI:  Steve Taylor is a 53 y.o. who identifies as a male who was assigned male at birth.   Social history: Lives with: wife and kids Work history: Airline pilot rep for [employer] in today for follow up of the following chronic medical issues:  1. Annual physical exam No issues  2. Primary hypertension No c/o chest pain, sob or headache. Does not check blood pressure at home. BP Readings from Last 3 Encounters:  03/11/24 (!) 144/99  08/18/23 (!) 143/99  06/02/23 138/88     3. Acquired hypothyroidism No issues  4. Gastroesophageal reflux disease without esophagitis Is o protonix  and is doing well.   New complaints: Fatigue- would like to have testosterone  checked. Nocturia- 4-5 x week.   No Known Allergies Outpatient Encounter Medications as of 03/11/2024  Medication Sig   levocetirizine (XYZAL ) 5 MG tablet Take 1 tablet (5 mg total) by mouth every evening.   levothyroxine  (SYNTHROID ) 112 MCG tablet Take 1 tablet (112 mcg total) by mouth daily.   metoprolol  succinate (TOPROL -XL) 25 MG 24 hr tablet Take 1 tablet (25 mg total) by mouth daily.   pantoprazole  (PROTONIX ) 40 MG tablet Take 1 tablet (40 mg total) by mouth daily.   No facility-administered encounter medications on file as of 03/11/2024.    Past Surgical History:  Procedure Laterality Date   HEMORROIDECTOMY     KIDNEY SURGERY     part of pancrease removed     SPLENECTOMY, TOTAL      Family History  Problem Relation Age of Onset   Diabetes Mother    Lung cancer Father    Brain cancer Maternal Grandmother    Diabetes Maternal Grandfather    Colon cancer Neg Hx    Esophageal cancer Neg Hx    Pancreatic cancer Neg Hx    Prostate cancer Neg Hx    Rectal cancer Neg Hx    Stomach cancer Neg Hx    Colon polyps Neg Hx    Crohn's disease Neg Hx     Ulcerative colitis Neg Hx       Controlled substance contract: n/a     Review of Systems  Constitutional:  Negative for diaphoresis.  Eyes:  Negative for pain.  Respiratory:  Negative for shortness of breath.   Cardiovascular:  Negative for chest pain, palpitations and leg swelling.  Gastrointestinal:  Negative for abdominal pain.  Endocrine: Negative for polydipsia.  Skin:  Negative for rash.  Neurological:  Negative for dizziness, weakness and headaches.  Hematological:  Does not bruise/bleed easily.  All other systems reviewed and are negative.      Objective:   Physical Exam Vitals and nursing note reviewed.  Constitutional:      Appearance: Normal appearance. He is well-developed.  HENT:     Head: Normocephalic.     Nose: Nose normal.     Mouth/Throat:     Mouth: Mucous membranes are moist.     Pharynx: Oropharynx is clear.  Eyes:     Pupils: Pupils are equal, round, and reactive to light.  Neck:     Thyroid : No thyroid  mass or thyromegaly.     Vascular: No carotid bruit or JVD.     Trachea: Phonation normal.  Cardiovascular:     Rate and Rhythm: Normal  rate and regular rhythm.  Pulmonary:     Effort: Pulmonary effort is normal. No respiratory distress.     Breath sounds: Normal breath sounds.  Abdominal:     General: Bowel sounds are normal.     Palpations: Abdomen is soft.     Tenderness: There is no abdominal tenderness.  Musculoskeletal:        General: Normal range of motion.     Cervical back: Normal range of motion and neck supple.  Lymphadenopathy:     Cervical: No cervical adenopathy.  Skin:    General: Skin is warm and dry.  Neurological:     Mental Status: He is alert and oriented to person, place, and time.  Psychiatric:        Behavior: Behavior normal.        Thought Content: Thought content normal.        Judgment: Judgment normal.    BP (!) 144/99   Pulse (!) 56   Temp (!) 97.5 F (36.4 C) (Temporal)   Ht 6\' 3"  (1.905 m)    Wt 264 lb (119.7 kg)   SpO2 93%   BMI 33.00 kg/m         Assessment & Plan:   Steve Taylor comes in today with chief complaint of Annual Exam   Diagnosis and orders addressed:  1. Annual physical exam (Primary)  2. Primary hypertension Low sodium diet - CBC with Differential/Platelet - CMP14+EGFR - Lipid panel  3. Acquired hypothyroidism Labs pending  4. Gastroesophageal reflux disease without esophagitis Watch diet  5. Other fatigue Labs pending - Testosterone ,Free and Total  6. Nocturia Force fluids - PSA, total and free   Labs pending Health Maintenance reviewed Diet and exercise encouraged  Follow up plan: 6 months   Mary-Margaret Gaylyn Keas, FNP

## 2024-03-12 LAB — PSA, TOTAL AND FREE
PSA, Free Pct: 32.2 %
PSA, Free: 0.74 ng/mL
Prostate Specific Ag, Serum: 2.3 ng/mL (ref 0.0–4.0)

## 2024-03-12 LAB — CMP14+EGFR
ALT: 64 IU/L — ABNORMAL HIGH (ref 0–44)
AST: 44 IU/L — ABNORMAL HIGH (ref 0–40)
Albumin: 4.3 g/dL (ref 3.8–4.9)
Alkaline Phosphatase: 89 IU/L (ref 44–121)
BUN/Creatinine Ratio: 11 (ref 9–20)
BUN: 12 mg/dL (ref 6–24)
Bilirubin Total: 0.9 mg/dL (ref 0.0–1.2)
CO2: 23 mmol/L (ref 20–29)
Calcium: 9.8 mg/dL (ref 8.7–10.2)
Chloride: 103 mmol/L (ref 96–106)
Creatinine, Ser: 1.12 mg/dL (ref 0.76–1.27)
Globulin, Total: 2.6 g/dL (ref 1.5–4.5)
Glucose: 115 mg/dL — ABNORMAL HIGH (ref 70–99)
Potassium: 4.1 mmol/L (ref 3.5–5.2)
Sodium: 142 mmol/L (ref 134–144)
Total Protein: 6.9 g/dL (ref 6.0–8.5)
eGFR: 80 mL/min/{1.73_m2} (ref >59)

## 2024-03-12 LAB — CBC WITH DIFFERENTIAL/PLATELET
Basophils Absolute: 0.2 10*3/uL (ref 0.0–0.2)
Basos: 2 %
EOS (ABSOLUTE): 0.4 10*3/uL (ref 0.0–0.4)
Eos: 4 %
Hematocrit: 48.5 % (ref 37.5–51.0)
Hemoglobin: 16.4 g/dL (ref 13.0–17.7)
Immature Grans (Abs): 0 10*3/uL (ref 0.0–0.1)
Immature Granulocytes: 0 %
Lymphocytes Absolute: 3.5 10*3/uL — ABNORMAL HIGH (ref 0.7–3.1)
Lymphs: 39 %
MCH: 30.4 pg (ref 26.6–33.0)
MCHC: 33.8 g/dL (ref 31.5–35.7)
MCV: 90 fL (ref 79–97)
Monocytes Absolute: 0.7 10*3/uL (ref 0.1–0.9)
Monocytes: 8 %
Neutrophils Absolute: 4.3 10*3/uL (ref 1.4–7.0)
Neutrophils: 47 %
Platelets: 404 10*3/uL (ref 150–450)
RBC: 5.39 x10E6/uL (ref 4.14–5.80)
RDW: 14.2 % (ref 11.6–15.4)
WBC: 9 10*3/uL (ref 3.4–10.8)

## 2024-03-12 LAB — TESTOSTERONE,FREE AND TOTAL
Testosterone, Free: 5.9 pg/mL — ABNORMAL LOW (ref 7.2–24.0)
Testosterone: 343 ng/dL (ref 264–916)

## 2024-03-12 LAB — LIPID PANEL
Chol/HDL Ratio: 5.2 ratio — ABNORMAL HIGH (ref 0.0–5.0)
Cholesterol, Total: 198 mg/dL (ref 100–199)
HDL: 38 mg/dL — ABNORMAL LOW (ref >39)
LDL Chol Calc (NIH): 140 mg/dL — ABNORMAL HIGH (ref 0–99)
Triglycerides: 108 mg/dL (ref 0–149)
VLDL Cholesterol Cal: 20 mg/dL (ref 5–40)

## 2024-03-14 ENCOUNTER — Other Ambulatory Visit: Payer: Self-pay | Admitting: Family Medicine

## 2024-03-14 DIAGNOSIS — H1013 Acute atopic conjunctivitis, bilateral: Secondary | ICD-10-CM

## 2024-03-15 ENCOUNTER — Encounter: Payer: Self-pay | Admitting: Family Medicine

## 2024-04-03 ENCOUNTER — Other Ambulatory Visit: Payer: Self-pay

## 2024-04-03 ENCOUNTER — Ambulatory Visit
Admission: RE | Admit: 2024-04-03 | Discharge: 2024-04-03 | Disposition: A | Discharge status: Home / Self Care | Source: Ambulatory Visit | Attending: Family Medicine | Admitting: Family Medicine

## 2024-04-03 VITALS — BP 143/106 | HR 73 | Temp 99.0°F | Resp 18

## 2024-04-03 DIAGNOSIS — J069 Acute upper respiratory infection, unspecified: Secondary | ICD-10-CM

## 2024-04-03 DIAGNOSIS — R059 Cough, unspecified: Secondary | ICD-10-CM

## 2024-04-03 MED ORDER — AMOXICILLIN-POT CLAVULANATE 875-125 MG PO TABS: 1.0000 | ORAL_TABLET | Freq: Two times a day (BID) | ORAL | 0 refills | Status: DC

## 2024-04-03 MED ORDER — PREDNISONE 20 MG PO TABS
ORAL_TABLET | ORAL | 0 refills | Status: DC
Start: 2024-04-03 — End: 2024-09-13

## 2024-04-03 NOTE — Discharge Instructions (Addendum)
 Advised patient to take medications as directed with food to completion.  Advised take prednisone  with first dose of Augmentin  for the next 5 of 7 days.  Encouraged increase daily water intake to 64 ounces per day while taking these medications.  Advised if symptoms worsen and/or unresolved please follow-up with your PCP or here for further evaluation.
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
## 2024-04-03 NOTE — ED Triage Notes (Signed)
 Patient presents to Urgent Care with complaints of cough-productive, runny nose, sneezing, shortness of breath since 4-5 days ago. Patient reports does have allergies and ran out of medication. Symptoms persist after restarting his allergy medication. Denies any fever or chills.Aaron Aas

## 2024-05-26 DIAGNOSIS — L814 Other melanin hyperpigmentation: Secondary | ICD-10-CM | POA: Diagnosis not present

## 2024-05-26 DIAGNOSIS — L918 Other hypertrophic disorders of the skin: Secondary | ICD-10-CM | POA: Diagnosis not present

## 2024-05-26 DIAGNOSIS — D485 Neoplasm of uncertain behavior of skin: Secondary | ICD-10-CM | POA: Diagnosis not present

## 2024-05-26 DIAGNOSIS — Z85828 Personal history of other malignant neoplasm of skin: Secondary | ICD-10-CM | POA: Diagnosis not present

## 2024-05-26 DIAGNOSIS — D225 Melanocytic nevi of trunk: Secondary | ICD-10-CM | POA: Diagnosis not present

## 2024-05-26 DIAGNOSIS — D1801 Hemangioma of skin and subcutaneous tissue: Secondary | ICD-10-CM | POA: Diagnosis not present

## 2024-05-26 DIAGNOSIS — L821 Other seborrheic keratosis: Secondary | ICD-10-CM | POA: Diagnosis not present

## 2024-06-06 ENCOUNTER — Other Ambulatory Visit (HOSPITAL_COMMUNITY): Payer: Self-pay

## 2024-06-08 ENCOUNTER — Other Ambulatory Visit (HOSPITAL_COMMUNITY): Payer: Self-pay

## 2024-06-08 ENCOUNTER — Other Ambulatory Visit: Payer: Self-pay

## 2024-06-08 MED FILL — Levocetirizine Dihydrochloride Tab 5 MG: ORAL | 30 days supply | Qty: 30 | Fill #0 | Status: AC

## 2024-06-25 ENCOUNTER — Other Ambulatory Visit: Payer: Self-pay | Admitting: Family Medicine

## 2024-06-25 DIAGNOSIS — H1013 Acute atopic conjunctivitis, bilateral: Secondary | ICD-10-CM

## 2024-08-02 ENCOUNTER — Other Ambulatory Visit (HOSPITAL_COMMUNITY): Payer: Self-pay

## 2024-08-02 ENCOUNTER — Other Ambulatory Visit: Payer: Self-pay | Admitting: Family Medicine

## 2024-08-02 DIAGNOSIS — H1013 Acute atopic conjunctivitis, bilateral: Secondary | ICD-10-CM

## 2024-08-03 ENCOUNTER — Ambulatory Visit: Admitting: Podiatry

## 2024-08-04 ENCOUNTER — Other Ambulatory Visit (HOSPITAL_COMMUNITY): Payer: Self-pay

## 2024-08-08 ENCOUNTER — Other Ambulatory Visit: Payer: Self-pay

## 2024-08-08 ENCOUNTER — Telehealth: Payer: Self-pay | Admitting: Family Medicine

## 2024-08-08 ENCOUNTER — Other Ambulatory Visit (HOSPITAL_COMMUNITY): Payer: Self-pay

## 2024-08-08 DIAGNOSIS — H1013 Acute atopic conjunctivitis, bilateral: Secondary | ICD-10-CM

## 2024-08-08 MED ORDER — LEVOCETIRIZINE DIHYDROCHLORIDE 5 MG PO TABS
5.0000 mg | ORAL_TABLET | Freq: Every evening | ORAL | 1 refills | Status: DC
  Filled 2024-08-08: qty 90, 90d supply, fill #0

## 2024-08-08 NOTE — Telephone Encounter (Signed)
Refill sent to [facility].

## 2024-08-08 NOTE — Telephone Encounter (Signed)
 Copied from CRM [id number]. Topic: Clinical - Prescription Issue >> Aug 08, 2024  2:48 PM Tobias CROME wrote: Reason for CRM: Patient's wife states patient is unable to get levocetirizine prescription from [facility] due to insurance.   Requesting for levocetirizine to be sent [facility] - [address] [place] [address]

## 2024-08-17 ENCOUNTER — Encounter: Payer: Self-pay | Admitting: Podiatry

## 2024-08-17 ENCOUNTER — Ambulatory Visit: Admitting: Podiatry

## 2024-08-17 DIAGNOSIS — B351 Tinea unguium: Secondary | ICD-10-CM

## 2024-08-17 DIAGNOSIS — L600 Ingrowing nail: Secondary | ICD-10-CM | POA: Diagnosis not present

## 2024-08-17 NOTE — Progress Notes (Signed)
 Subjective:   Patient ID: Steve Taylor, male   DOB: 53 y.o.   MRN: [id number]   HPI Patient presents long-term history of damage big toenails bilateral and slightly second right and states that he damaged them when wrestling with his son approximately 10 years ago.  Patient states they are not currently tender he does get periodic ingrown toenails he can work on himself and he does not smoke likes to be active   Review of Systems  All other systems reviewed and are negative.       Objective:  Physical Exam Vitals and nursing note reviewed.  Constitutional:      Appearance: He is well-developed.  Pulmonary:     Effort: Pulmonary effort is normal.  Musculoskeletal:        General: Normal range of motion.  Skin:    General: Skin is warm.  Neurological:     Mental Status: He is alert.     Neurovascular status intact muscle strength adequate range of motion adequate with thickness of the hallux nails bilateral slight incurvation of the beds right and the left medial border no redness no drainage noted currently.  Good digital perfusion well-oriented x 3     Assessment:  Damage to the hallux nails bilateral with possible secondary fungal infection and mild ingrown component     Plan:  H&P reviewed at great length.  Did discuss with him treatment utilizing oral medicine and laser topical but given the fact the trauma seems to be the more dominant element I do not recommend that currently and would rather he continue to keep them down and recommended ways to keep the nails and.  Discussed correction of ingrown toenails explained to him what we do but I do not recommend currently as they are not sore but may need to be done in future and made him aware of treatment and recovery

## 2024-09-05 ENCOUNTER — Other Ambulatory Visit: Payer: Self-pay | Admitting: Family Medicine

## 2024-09-05 DIAGNOSIS — K219 Gastro-esophageal reflux disease without esophagitis: Secondary | ICD-10-CM

## 2024-09-05 DIAGNOSIS — I10 Essential (primary) hypertension: Secondary | ICD-10-CM

## 2024-09-06 ENCOUNTER — Other Ambulatory Visit: Payer: Self-pay

## 2024-09-06 ENCOUNTER — Other Ambulatory Visit (HOSPITAL_COMMUNITY): Payer: Self-pay

## 2024-09-06 MED ORDER — METOPROLOL SUCCINATE ER 25 MG PO TB24
25.0000 mg | ORAL_TABLET | Freq: Every day | ORAL | 0 refills | Status: DC
Start: 2024-09-06 — End: 2024-09-13
  Filled 2024-09-06: qty 90, 90d supply, fill #0

## 2024-09-06 MED ORDER — PANTOPRAZOLE SODIUM 40 MG PO TBEC
40.0000 mg | DELAYED_RELEASE_TABLET | Freq: Every day | ORAL | 0 refills | Status: DC
  Filled 2024-09-06: qty 90, 90d supply, fill #0

## 2024-09-13 ENCOUNTER — Ambulatory Visit: Admitting: Family Medicine

## 2024-09-13 ENCOUNTER — Encounter: Payer: Self-pay | Admitting: Family Medicine

## 2024-09-13 ENCOUNTER — Other Ambulatory Visit (HOSPITAL_COMMUNITY): Payer: Self-pay

## 2024-09-13 ENCOUNTER — Other Ambulatory Visit: Payer: Self-pay

## 2024-09-13 VITALS — BP 128/74 | HR 60 | Temp 96.6°F | Ht 75.0 in | Wt 270.8 lb

## 2024-09-13 DIAGNOSIS — I10 Essential (primary) hypertension: Secondary | ICD-10-CM

## 2024-09-13 DIAGNOSIS — E039 Hypothyroidism, unspecified: Secondary | ICD-10-CM

## 2024-09-13 DIAGNOSIS — R202 Paresthesia of skin: Secondary | ICD-10-CM | POA: Diagnosis not present

## 2024-09-13 DIAGNOSIS — H1013 Acute atopic conjunctivitis, bilateral: Secondary | ICD-10-CM | POA: Diagnosis not present

## 2024-09-13 DIAGNOSIS — R351 Nocturia: Secondary | ICD-10-CM | POA: Diagnosis not present

## 2024-09-13 DIAGNOSIS — K219 Gastro-esophageal reflux disease without esophagitis: Secondary | ICD-10-CM | POA: Diagnosis not present

## 2024-09-13 DIAGNOSIS — R7303 Prediabetes: Secondary | ICD-10-CM | POA: Diagnosis not present

## 2024-09-13 DIAGNOSIS — R5381 Other malaise: Secondary | ICD-10-CM | POA: Diagnosis not present

## 2024-09-13 DIAGNOSIS — J309 Allergic rhinitis, unspecified: Secondary | ICD-10-CM

## 2024-09-13 DIAGNOSIS — Z905 Acquired absence of kidney: Secondary | ICD-10-CM | POA: Diagnosis not present

## 2024-09-13 DIAGNOSIS — R5383 Other fatigue: Secondary | ICD-10-CM | POA: Diagnosis not present

## 2024-09-13 LAB — BAYER DCA HB A1C WAIVED: HB A1C (BAYER DCA - WAIVED): 5.9 % — ABNORMAL HIGH (ref 4.8–5.6)

## 2024-09-13 MED ORDER — PANTOPRAZOLE SODIUM 40 MG PO TBEC
40.0000 mg | DELAYED_RELEASE_TABLET | Freq: Every day | ORAL | 1 refills | Status: AC
  Filled 2024-09-13 – 2024-12-06 (×4): qty 90, 90d supply, fill #0
  Filled 2024-12-09: qty 30, 30d supply, fill #0

## 2024-09-13 MED ORDER — METOPROLOL SUCCINATE ER 25 MG PO TB24
25.0000 mg | ORAL_TABLET | Freq: Every day | ORAL | 1 refills | Status: AC
  Filled 2024-09-13 – 2024-12-06 (×4): qty 90, 90d supply, fill #0
  Filled 2024-12-09: qty 30, 30d supply, fill #0

## 2024-09-13 MED ORDER — LEVOTHYROXINE SODIUM 112 MCG PO TABS
112.0000 ug | ORAL_TABLET | Freq: Every day | ORAL | 1 refills | Status: AC
  Filled 2024-09-13: qty 90, 90d supply, fill #0

## 2024-09-13 MED ORDER — FEXOFENADINE HCL 180 MG PO TABS
180.0000 mg | ORAL_TABLET | Freq: Every day | ORAL | 1 refills | Status: AC
  Filled 2024-09-13 – 2024-11-21 (×2): qty 90, 90d supply, fill #0
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
## 2024-09-14 ENCOUNTER — Ambulatory Visit: Payer: Self-pay | Admitting: Family Medicine

## 2024-09-14 LAB — CMP14+EGFR
ALT: 45 IU/L — ABNORMAL HIGH (ref 0–44)
AST: 36 IU/L (ref 0–40)
Albumin: 4.2 g/dL (ref 3.8–4.9)
Alkaline Phosphatase: 90 IU/L (ref 47–123)
BUN/Creatinine Ratio: 9 (ref 9–20)
BUN: 11 mg/dL (ref 6–24)
Bilirubin Total: 0.7 mg/dL (ref 0.0–1.2)
CO2: 22 mmol/L (ref 20–29)
Calcium: 9.6 mg/dL (ref 8.7–10.2)
Chloride: 102 mmol/L (ref 96–106)
Creatinine, Ser: 1.17 mg/dL (ref 0.76–1.27)
Globulin, Total: 2.6 g/dL (ref 1.5–4.5)
Glucose: 125 mg/dL — ABNORMAL HIGH (ref 70–99)
Potassium: 4.2 mmol/L (ref 3.5–5.2)
Sodium: 138 mmol/L (ref 134–144)
Total Protein: 6.8 g/dL (ref 6.0–8.5)
eGFR: 75 mL/min/1.73 (ref >59)

## 2024-09-14 LAB — T4, FREE: Free T4: 1.37 ng/dL (ref 0.82–1.77)

## 2024-09-14 LAB — PSA, TOTAL AND FREE
PSA, Free Pct: 28.3 %
PSA, Free: 0.65 ng/mL
Prostate Specific Ag, Serum: 2.3 ng/mL (ref 0.0–4.0)

## 2024-09-14 LAB — TESTOSTERONE,FREE AND TOTAL
Testosterone, Free: 8.4 pg/mL (ref 7.2–24.0)
Testosterone: 450 ng/dL (ref 264–916)

## 2024-09-14 LAB — CBC WITH DIFFERENTIAL/PLATELET
Basophils Absolute: 0.2 x10E3/uL (ref 0.0–0.2)
Basos: 2 %
EOS (ABSOLUTE): 0.4 x10E3/uL (ref 0.0–0.4)
Eos: 4 %
Hematocrit: 48.6 % (ref 37.5–51.0)
Hemoglobin: 16.5 g/dL (ref 13.0–17.7)
Immature Grans (Abs): 0 x10E3/uL (ref 0.0–0.1)
Immature Granulocytes: 0 %
Lymphocytes Absolute: 3.2 x10E3/uL — ABNORMAL HIGH (ref 0.7–3.1)
Lymphs: 35 %
MCH: 30.3 pg (ref 26.6–33.0)
MCHC: 34 g/dL (ref 31.5–35.7)
MCV: 89 fL (ref 79–97)
Monocytes Absolute: 0.7 x10E3/uL (ref 0.1–0.9)
Monocytes: 7 %
Neutrophils Absolute: 4.7 x10E3/uL (ref 1.4–7.0)
Neutrophils: 52 %
Platelets: 416 x10E3/uL (ref 150–450)
RBC: 5.45 x10E6/uL (ref 4.14–5.80)
RDW: 14.2 % (ref 11.6–15.4)
WBC: 9.2 x10E3/uL (ref 3.4–10.8)

## 2024-09-14 LAB — LIPID PANEL
Chol/HDL Ratio: 5.1 ratio — ABNORMAL HIGH (ref 0.0–5.0)
Cholesterol, Total: 211 mg/dL — ABNORMAL HIGH (ref 100–199)
HDL: 41 mg/dL (ref >39)
LDL Chol Calc (NIH): 146 mg/dL — ABNORMAL HIGH (ref 0–99)
Triglycerides: 134 mg/dL (ref 0–149)
VLDL Cholesterol Cal: 24 mg/dL (ref 5–40)

## 2024-09-14 LAB — VITAMIN B12: Vitamin B-12: 343 pg/mL (ref 232–1245)

## 2024-09-14 LAB — TSH: TSH: 3.74 u[IU]/mL (ref 0.450–4.500)

## 2024-09-22 ENCOUNTER — Other Ambulatory Visit (HOSPITAL_COMMUNITY): Payer: Self-pay

## 2024-11-07 ENCOUNTER — Other Ambulatory Visit (HOSPITAL_COMMUNITY): Payer: Self-pay

## 2024-11-07 ENCOUNTER — Other Ambulatory Visit: Payer: Self-pay | Admitting: Family Medicine

## 2024-11-07 DIAGNOSIS — H1013 Acute atopic conjunctivitis, bilateral: Secondary | ICD-10-CM

## 2024-11-21 ENCOUNTER — Other Ambulatory Visit (HOSPITAL_COMMUNITY): Payer: Self-pay

## 2024-11-21 ENCOUNTER — Other Ambulatory Visit: Payer: Self-pay

## 2024-12-06 ENCOUNTER — Other Ambulatory Visit (HOSPITAL_COMMUNITY): Payer: Self-pay

## 2024-12-09 ENCOUNTER — Other Ambulatory Visit (HOSPITAL_COMMUNITY): Payer: Self-pay

## 2024-12-09 ENCOUNTER — Other Ambulatory Visit (HOSPITAL_BASED_OUTPATIENT_CLINIC_OR_DEPARTMENT_OTHER): Payer: Self-pay

## 2024-12-10 ENCOUNTER — Other Ambulatory Visit (HOSPITAL_BASED_OUTPATIENT_CLINIC_OR_DEPARTMENT_OTHER): Payer: Self-pay

## 2024-12-12 ENCOUNTER — Other Ambulatory Visit (HOSPITAL_COMMUNITY): Payer: Self-pay

## 2025-03-16 ENCOUNTER — Encounter: Payer: Self-pay | Admitting: Family Medicine
